# Patient Record
Sex: Female | Born: 1935
Health system: Southern US, Community
[De-identification: ages and names within clinical notes are randomized; demographics above are authoritative.]

## PROBLEM LIST (undated history)

## (undated) DIAGNOSIS — F419 Anxiety disorder, unspecified: Secondary | ICD-10-CM

## (undated) DIAGNOSIS — I10 Essential (primary) hypertension: Secondary | ICD-10-CM

## (undated) DIAGNOSIS — M1990 Unspecified osteoarthritis, unspecified site: Secondary | ICD-10-CM

## (undated) DIAGNOSIS — R002 Palpitations: Secondary | ICD-10-CM

## (undated) DIAGNOSIS — I2510 Atherosclerotic heart disease of native coronary artery without angina pectoris: Secondary | ICD-10-CM

## (undated) DIAGNOSIS — I255 Ischemic cardiomyopathy: Secondary | ICD-10-CM

## (undated) DIAGNOSIS — E785 Hyperlipidemia, unspecified: Secondary | ICD-10-CM

## (undated) HISTORY — PX: BLADDER REPAIR: SHX76

## (undated) HISTORY — DX: Atherosclerotic heart disease of native coronary artery without angina pectoris: I25.10

## (undated) HISTORY — DX: Hyperlipidemia, unspecified: E78.5

## (undated) HISTORY — DX: Palpitations: R00.2

## (undated) HISTORY — DX: Anxiety disorder, unspecified: F41.9

## (undated) HISTORY — DX: Essential (primary) hypertension: I10

---

## 1974-09-17 HISTORY — PX: ABDOMINAL HYSTERECTOMY: SHX81

## 2001-01-02 ENCOUNTER — Ambulatory Visit (HOSPITAL_COMMUNITY): Admission: RE | Admit: 2001-01-02 | Discharge: 2001-01-02 | Payer: Self-pay | Admitting: *Deleted

## 2003-08-24 ENCOUNTER — Ambulatory Visit (HOSPITAL_COMMUNITY): Admission: RE | Admit: 2003-08-24 | Discharge: 2003-08-24 | Payer: Self-pay | Admitting: Family Medicine

## 2004-08-24 ENCOUNTER — Ambulatory Visit (HOSPITAL_COMMUNITY): Admission: RE | Admit: 2004-08-24 | Discharge: 2004-08-24 | Payer: Self-pay | Admitting: Family Medicine

## 2011-04-18 HISTORY — PX: ORIF TIBIA & FIBULA FRACTURES: SHX2131

## 2011-05-10 ENCOUNTER — Emergency Department (HOSPITAL_COMMUNITY): Payer: No Typology Code available for payment source

## 2011-05-10 ENCOUNTER — Inpatient Hospital Stay (HOSPITAL_COMMUNITY)
Admission: AD | Admit: 2011-05-10 | Discharge: 2011-05-14 | DRG: 494 | Disposition: A | Discharge status: Home / Self Care | Payer: No Typology Code available for payment source | Source: Other Acute Inpatient Hospital | Attending: Orthopedic Surgery | Admitting: Orthopedic Surgery

## 2011-05-10 ENCOUNTER — Other Ambulatory Visit: Payer: Self-pay

## 2011-05-10 ENCOUNTER — Inpatient Hospital Stay (HOSPITAL_COMMUNITY): Payer: No Typology Code available for payment source

## 2011-05-10 ENCOUNTER — Emergency Department (HOSPITAL_COMMUNITY)
Admission: EM | Admit: 2011-05-10 | Discharge: 2011-05-10 | Disposition: A | Discharge status: Other Acute Inpatient Hospital | Payer: No Typology Code available for payment source | Source: Home / Self Care | Attending: Emergency Medicine | Admitting: Emergency Medicine

## 2011-05-10 DIAGNOSIS — S82209A Unspecified fracture of shaft of unspecified tibia, initial encounter for closed fracture: Secondary | ICD-10-CM

## 2011-05-10 DIAGNOSIS — R079 Chest pain, unspecified: Secondary | ICD-10-CM | POA: Diagnosis present

## 2011-05-10 DIAGNOSIS — Y92410 Unspecified street and highway as the place of occurrence of the external cause: Secondary | ICD-10-CM | POA: Insufficient documentation

## 2011-05-10 DIAGNOSIS — I498 Other specified cardiac arrhythmias: Secondary | ICD-10-CM | POA: Insufficient documentation

## 2011-05-10 DIAGNOSIS — Y998 Other external cause status: Secondary | ICD-10-CM

## 2011-05-10 DIAGNOSIS — S82409B Unspecified fracture of shaft of unspecified fibula, initial encounter for open fracture type I or II: Secondary | ICD-10-CM | POA: Insufficient documentation

## 2011-05-10 DIAGNOSIS — R030 Elevated blood-pressure reading, without diagnosis of hypertension: Secondary | ICD-10-CM | POA: Diagnosis not present

## 2011-05-10 DIAGNOSIS — S82209B Unspecified fracture of shaft of unspecified tibia, initial encounter for open fracture type I or II: Principal | ICD-10-CM | POA: Diagnosis present

## 2011-05-10 DIAGNOSIS — R000 Tachycardia, unspecified: Secondary | ICD-10-CM | POA: Diagnosis not present

## 2011-05-10 LAB — CBC
HCT: 42.9 % (ref 36.0–46.0)
Hemoglobin: 14.4 g/dL (ref 12.0–15.0)
MCH: 31.7 pg (ref 26.0–34.0)
MCHC: 33.6 g/dL (ref 30.0–36.0)
MCV: 94.5 fL (ref 78.0–100.0)
Platelets: 217 10*3/uL (ref 150–400)
RBC: 4.54 MIL/uL (ref 3.87–5.11)
RDW: 12.8 % (ref 11.5–15.5)

## 2011-05-10 LAB — BASIC METABOLIC PANEL
BUN: 20 mg/dL (ref 6–23)
CO2: 25 mEq/L (ref 19–32)
Calcium: 10.2 mg/dL (ref 8.4–10.5)
Chloride: 101 mEq/L (ref 96–112)
Creatinine, Ser: 0.54 mg/dL (ref 0.50–1.10)
GFR calc Af Amer: >60 mL/min (ref >60)
GFR calc non Af Amer: >60 mL/min (ref >60)
Glucose, Bld: 167 mg/dL — ABNORMAL HIGH (ref 70–99)
Potassium: 3.5 mEq/L (ref 3.5–5.1)
Sodium: 138 mEq/L (ref 135–145)

## 2011-05-10 LAB — URINALYSIS, ROUTINE W REFLEX MICROSCOPIC
Bilirubin Urine: NEGATIVE
Glucose, UA: 100 mg/dL — AB
Hgb urine dipstick: NEGATIVE
Leukocytes, UA: NEGATIVE
Nitrite: NEGATIVE
Protein, ur: NEGATIVE mg/dL
Specific Gravity, Urine: >1.03 — ABNORMAL HIGH (ref 1.005–1.030)
Urobilinogen, UA: 0.2 mg/dL (ref 0.0–1.0)
pH: 5.5 (ref 5.0–8.0)

## 2011-05-10 LAB — APTT: aPTT: 23 seconds — ABNORMAL LOW (ref 24–37)

## 2011-05-10 LAB — PROTIME-INR: INR: 0.88 (ref 0.00–1.49)

## 2011-05-10 MED ORDER — SODIUM CHLORIDE 0.9 % IV SOLN
INTRAVENOUS | Status: DC
  Administered 2011-05-10: 16:00:00 via INTRAVENOUS

## 2011-05-10 MED ORDER — FENTANYL CITRATE 0.05 MG/ML IJ SOLN
50.0000 ug | Freq: Once | INTRAMUSCULAR | Status: AC
  Administered 2011-05-10: 50 ug via INTRAVENOUS

## 2011-05-10 MED ORDER — CEFAZOLIN SODIUM 1-5 GM-% IV SOLN
1.0000 g | Freq: Once | INTRAVENOUS | Status: AC
  Administered 2011-05-10: 1 g via INTRAVENOUS
  Filled 2011-05-10: qty 50

## 2011-05-10 MED ORDER — FENTANYL CITRATE 0.05 MG/ML IJ SOLN
INTRAMUSCULAR | Status: AC
  Administered 2011-05-10: 50 ug via INTRAVENOUS
  Filled 2011-05-10: qty 2
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
## 2011-05-10 NOTE — ED Notes (Signed)
Report given to Blima Rich RN

## 2011-05-10 NOTE — ED Notes (Signed)
Bruising more noticeable to r lower leg, pt still able to wiggle toes, pedal pulse present.  PT requesting medication for pain.  Notified edp.

## 2011-05-10 NOTE — ED Notes (Signed)
Wound cleaned and dressing applied.  

## 2011-05-11 DIAGNOSIS — R011 Cardiac murmur, unspecified: Secondary | ICD-10-CM

## 2011-05-11 LAB — BASIC METABOLIC PANEL
BUN: 15 mg/dL (ref 6–23)
GFR calc Af Amer: >60 mL/min (ref >60)
GFR calc non Af Amer: >60 mL/min (ref >60)
Potassium: 4.2 mEq/L (ref 3.5–5.1)
Sodium: 139 mEq/L (ref 135–145)

## 2011-05-11 LAB — URINE CULTURE
Colony Count: NO GROWTH
Culture  Setup Time: 201208240147
Culture: NO GROWTH

## 2011-05-11 LAB — PROTIME-INR: Prothrombin Time: 13.8 seconds (ref 11.6–15.2)

## 2011-05-11 LAB — CBC
MCH: 31.2 pg (ref 26.0–34.0)
Platelets: 204 10*3/uL (ref 150–400)
RBC: 3.75 MIL/uL — ABNORMAL LOW (ref 3.87–5.11)
WBC: 11.1 10*3/uL — ABNORMAL HIGH (ref 4.0–10.5)
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
## 2011-05-12 LAB — CBC
Hemoglobin: 11.5 g/dL — ABNORMAL LOW (ref 12.0–15.0)
MCHC: 34.1 g/dL (ref 30.0–36.0)

## 2011-05-12 LAB — PROTIME-INR
INR: 1.11 (ref 0.00–1.49)
Prothrombin Time: 14.5 seconds (ref 11.6–15.2)

## 2011-05-12 LAB — BASIC METABOLIC PANEL
GFR calc Af Amer: >60 mL/min (ref >60)
GFR calc non Af Amer: >60 mL/min (ref >60)
Glucose, Bld: 110 mg/dL — ABNORMAL HIGH (ref 70–99)
Potassium: 4 mEq/L (ref 3.5–5.1)
Sodium: 140 mEq/L (ref 135–145)

## 2011-05-13 LAB — PROTIME-INR: Prothrombin Time: 14.6 seconds (ref 11.6–15.2)

## 2011-05-14 ENCOUNTER — Inpatient Hospital Stay
Admission: RE | Admit: 2011-05-14 | Discharge: 2011-06-08 | Disposition: A | Discharge status: Home / Self Care | Payer: BC Managed Care – PPO | Source: Ambulatory Visit | Attending: Internal Medicine | Admitting: Internal Medicine

## 2011-05-14 LAB — PROTIME-INR: INR: 1.85 — ABNORMAL HIGH (ref 0.00–1.49)
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
## 2011-05-31 ENCOUNTER — Ambulatory Visit (INDEPENDENT_AMBULATORY_CARE_PROVIDER_SITE_OTHER): Payer: Medicare Other | Admitting: Cardiology

## 2011-05-31 ENCOUNTER — Encounter: Payer: Self-pay | Admitting: Cardiology

## 2011-05-31 VITALS — BP 128/78 | HR 96 | Ht 60.0 in | Wt 105.0 lb

## 2011-05-31 DIAGNOSIS — I517 Cardiomegaly: Secondary | ICD-10-CM | POA: Insufficient documentation

## 2011-05-31 DIAGNOSIS — R030 Elevated blood-pressure reading, without diagnosis of hypertension: Secondary | ICD-10-CM

## 2011-05-31 DIAGNOSIS — D649 Anemia, unspecified: Secondary | ICD-10-CM | POA: Insufficient documentation

## 2011-05-31 DIAGNOSIS — I10 Essential (primary) hypertension: Secondary | ICD-10-CM | POA: Insufficient documentation

## 2011-05-31 DIAGNOSIS — I471 Supraventricular tachycardia: Secondary | ICD-10-CM

## 2011-05-31 NOTE — Patient Instructions (Signed)
Your physician recommends that you return for lab work in: YOU HAVE BEEN GIVEN LAB ORDERS FOR A TSH AND FASTING LIPID PANEL 427.0  F/U IN 6 MONTHS WITH DR. Dietrich Pates

## 2011-05-31 NOTE — Assessment & Plan Note (Signed)
Blood pressure is normal at this visit in the absence of pharmacologic therapy.  Continued observation is appropriate.

## 2011-05-31 NOTE — Assessment & Plan Note (Addendum)
Patient has evidence for previous inferior MI on her EKG, but this apparently was not verified by her echocardiogram.  She has mild LVH with HOCM physiology, which may have reflected high adrenergic tone at tet time her echocardiogram was performed.  I will review the study and reassess this nice woman in 6 months; however, for now she does not appear to require additional testing or any pharmacologic therapy.
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
## 2011-06-15 ENCOUNTER — Other Ambulatory Visit: Payer: Self-pay

## 2011-06-15 DIAGNOSIS — D649 Anemia, unspecified: Secondary | ICD-10-CM

## 2011-06-15 DIAGNOSIS — E785 Hyperlipidemia, unspecified: Secondary | ICD-10-CM

## 2011-06-15 DIAGNOSIS — I10 Essential (primary) hypertension: Secondary | ICD-10-CM

## 2011-07-02 ENCOUNTER — Encounter: Payer: Self-pay | Admitting: Cardiology

## 2011-07-17 ENCOUNTER — Ambulatory Visit (INDEPENDENT_AMBULATORY_CARE_PROVIDER_SITE_OTHER): Payer: Medicare Other | Admitting: Adult Health

## 2011-07-17 ENCOUNTER — Encounter: Payer: Self-pay | Admitting: Adult Health

## 2011-07-17 VITALS — BP 178/101 | HR 114 | Ht 60.0 in | Wt 105.0 lb

## 2011-07-17 DIAGNOSIS — R002 Palpitations: Secondary | ICD-10-CM | POA: Insufficient documentation

## 2011-07-17 DIAGNOSIS — R030 Elevated blood-pressure reading, without diagnosis of hypertension: Secondary | ICD-10-CM

## 2011-07-17 DIAGNOSIS — I479 Paroxysmal tachycardia, unspecified: Secondary | ICD-10-CM

## 2011-07-17 NOTE — Assessment & Plan Note (Signed)
She is hypertensive and anxious today. Repeat BP 158/ 98. We will continue the lisinopril dose. If she comes back hypertensive, may have to increase or add medication. If BB is used for tachycardia, will not recommend increasing the dose.  Will make more recommendations after follow-up.

## 2011-07-17 NOTE — Assessment & Plan Note (Signed)
Will have cardionet monitor place to assess for rhythm and rate, along with frequency. I will not put her on rate control medications until we have a definite evaluation. She is advised to call our office should she experience rapid HR that does not resolve on its own.  Otherwise, we will see her on follow-up and make recommendations after she has monitor strips read.
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
## 2011-07-17 NOTE — Patient Instructions (Signed)
Your physician recommends that you schedule a follow-up appointment in: 1 month with Dr Dietrich Pates  Your physician has recommended that you wear an event monitor. Event monitors are medical devices that record the heart's electrical activity. Doctors most often [place] these monitors to diagnose arrhythmias. Arrhythmias are problems with the speed or rhythm of the heartbeat. The monitor is a small, portable device. You can wear one while you do your normal daily activities. This is usually used to diagnose what is causing palpitations/syncope (passing out).

## 2011-07-23 ENCOUNTER — Telehealth: Payer: Self-pay

## 2011-07-23 ENCOUNTER — Telehealth: Payer: Self-pay | Admitting: Adult Health
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
## 2011-07-23 NOTE — Telephone Encounter (Signed)
Patient states that she has questions regarding her "heart monitor" that Samara Deist ordered for her. / tg

## 2011-07-31 ENCOUNTER — Telehealth: Payer: Self-pay | Admitting: Cardiology

## 2011-07-31 NOTE — Telephone Encounter (Signed)
Patient states that her heart beats so fast at night that she can't sleep. Wants to know what she can do about it. / tg

## 2011-08-01 NOTE — Telephone Encounter (Signed)
Pt. states that she wakes up most every night with fast HR and that she is nervious through out the day. Her PCP, Dr. Regino Schultze, recommended that she take Xanax but pt. refused b/c she does not like the way she feels when she takes. Pt. is wearing an Event monitor at this time, we have not received any critical or abnormal readings from this. She takes Lisinopril 10 mg in the am and is not on any other cardiac medication at this time. Pt. Wants to know what she should do. Please advise./LV

## 2011-08-02 NOTE — Telephone Encounter (Signed)
Continue to wear cardiac monitor. Please call the company to see if there have been any arrhythmias are abnormal heart rates.  She can take xanax at night.

## 2011-08-03 NOTE — Telephone Encounter (Signed)
**Note De-Identified Burlene Montecalvo Obfuscation** Pt. advised that her Event monitor results have been normal to this point and that we will continue to monitor. Pt. refuses to take Xanax b/c she does not like the way they make her feel. Pt. advised to continue to wear monitor unti 08-09-11. She states  She states she understands instructions given. /LV

## 2011-08-07 ENCOUNTER — Ambulatory Visit (HOSPITAL_COMMUNITY)
Admission: RE | Admit: 2011-08-07 | Discharge: 2011-08-07 | Disposition: A | Discharge status: Home / Self Care | Payer: Medicare Other | Source: Ambulatory Visit | Attending: Family Medicine | Admitting: Family Medicine

## 2011-08-07 DIAGNOSIS — M25673 Stiffness of unspecified ankle, not elsewhere classified: Secondary | ICD-10-CM | POA: Insufficient documentation

## 2011-08-07 DIAGNOSIS — M6281 Muscle weakness (generalized): Secondary | ICD-10-CM | POA: Insufficient documentation

## 2011-08-07 DIAGNOSIS — R29898 Other symptoms and signs involving the musculoskeletal system: Secondary | ICD-10-CM | POA: Insufficient documentation

## 2011-08-07 DIAGNOSIS — M25579 Pain in unspecified ankle and joints of unspecified foot: Secondary | ICD-10-CM | POA: Insufficient documentation

## 2011-08-07 DIAGNOSIS — M25676 Stiffness of unspecified foot, not elsewhere classified: Secondary | ICD-10-CM | POA: Insufficient documentation

## 2011-08-07 DIAGNOSIS — IMO0001 Reserved for inherently not codable concepts without codable children: Secondary | ICD-10-CM | POA: Insufficient documentation

## 2011-08-07 DIAGNOSIS — R262 Difficulty in walking, not elsewhere classified: Secondary | ICD-10-CM | POA: Insufficient documentation
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
## 2011-08-14 ENCOUNTER — Ambulatory Visit (HOSPITAL_COMMUNITY)
Admission: RE | Admit: 2011-08-14 | Discharge: 2011-08-14 | Disposition: A | Discharge status: Home / Self Care | Payer: Medicare Other | Source: Ambulatory Visit | Attending: Family Medicine | Admitting: Family Medicine

## 2011-08-14 NOTE — Progress Notes (Signed)
Physical Therapy Treatment Patient Details  Name: [name] MRN: [id number] Date of Birth: 04/18/1928  Today's Date: 08/14/2011 Time: 3086-5784 Time Calculation (min): 47 min Visit#: 2  of 8   Re-eval: 09/06/11  Charge: therex 40 min  Subjective: Symptoms/Limitations Symptoms: I'm fine today, no pain R ankle. Pain Assessment Currently in Pain?: No/denies  Objective: Pt standing therex completed with cam boot WBAT  Exercise/Treatments Ankle Exercises Ankle Circles/Pumps: 10 reps;Seated Ankle Dorsiflexion: AROM;10 reps;Seated Ankle Plantar Flexion: AROM;10 reps;Seated Ankle Eversion: AROM;10 reps;Seated Ankle Inversion: AROM;10 reps;Seated Heel Raises: 15 reps;4 seconds Heel Raises Limitations: seated Toe Raise: 10 reps;5 seconds Toe Raise Limitations: seated  ABC's: 1 rep Towel Crunch: Limitations Towel Crunch Limitations: 2 min Towel Inversion/Eversion: Limitations Towel Inversion/Eversion Limitations: with knee mobilized by therapist for increased ankle ROM BAPS: Sitting;Level 2;10 reps;Limitations BAPS Limitations: A/P, R/L, CW/CCW with knee mobilized by therapist for increased ankle ROM   Physical Therapy Assessment and Plan PT Assessment and Plan Clinical Impression Statement: Pt with decreased R Ankle AROM and strength, required knee mobilization by therapist to reduce compensation to focus on ankle ROM. Pt unable to flex toes wtih towel cruch and the inversion/eversion towel therex. PT Plan: Continue with current POC, assess update from MD appt tomorrow, begin balance activities continue strengthening and ROM therex.    Goals    Problem List Patient Active Problem List  Diagnoses  . Borderline hypertension  . Anemia  . LVH (left ventricular hypertrophy)  . Tachycardia, paroxysmal  . Ankle weakness  . Stiffness of joint, not elsewhere classified, ankle and foot  . Difficulty in walking    PT - End of Session Activity Tolerance: Patient tolerated  treatment well General Behavior During Session: [facility] for tasks performed Cognition: [facility] for tasks performed  Juel Burrow 08/14/2011, 2:49 PM

## 2011-08-16 ENCOUNTER — Ambulatory Visit (HOSPITAL_COMMUNITY)
Admission: RE | Admit: 2011-08-16 | Discharge: 2011-08-16 | Disposition: A | Discharge status: Home / Self Care | Payer: Medicare Other | Source: Ambulatory Visit | Attending: Family Medicine | Admitting: Family Medicine

## 2011-08-16 NOTE — Progress Notes (Signed)
Physical Therapy Treatment Patient Details  Name: [name] MRN: [id number] Date of Birth: Nov 08, 1927  Today's Date: 08/16/2011 Time: 1478-2956 Time Calculation (min): 45 min Visit#: 3  of 8   Re-eval: 09/06/11  Charge: Gait 8 min therex 37 min  Subjective: No pain R ankle today, pt came in ambulating with CAM boot on brought in lace up brace to find out how to put on foot  Objective:   Mobility (including Balance) Ambulation/Gait Ambulation/Gait: Yes Ambulation/Gait Assistance: 6: Modified independent (Device/Increase time) Ambulation Distance (Feet): 250 Feet Assistive device: Straight cane Gait Pattern: Decreased stride length;Decreased stance time - right     Exercise/Treatments Ankle Exercises Ankle Circles/Pumps: HEP Ankle Dorsiflexion: AROM;10 reps;Seated  Ankle Plantar Flexion: AROM;10 reps;Seated  Ankle Eversion: AROM;10 reps;Seated  Ankle Inversion: AROM;10 reps;Seated  Heel Raises: 15 reps standing Toe Raise: 15 reps standing   ABC's: HEP Towel Crunch: 2 RT Towel Inversion/Eversion: Limitations  Towel Inversion/Eversion Limitations: with knee stabilized by therapist for increased ankle ROM  BAPS: Sitting;Level 2;10 reps;Limitations  BAPS Limitations: A/P, R/L, CW/CCW with knee mobilized by therapist for increased ankle ROM  Gait training with SPC for proper sequence mechanics. Tandem stance 2x 30" with HHA 3x 30" with HHA    Physical Therapy Assessment and Plan PT Assessment and Plan Clinical Impression Statement: Pt with improving ankle AROM with inversion/eversion, required knee stabilized with BAPS to reduce compensation.  Began gait with SPC with proper mechanics following vc for proper sequence.  Began balance activities with HHA required to prevent falls.  Pt educated on proper technique to put on/take off ankle lace brace, pt able to remove and place back on with min cueing required.  PT Plan: Continue with current POC, improve strength, ROM,  balance and begin tband activities to add to HEP next session.    Goals    Problem List Patient Active Problem List  Diagnoses  . Borderline hypertension  . Anemia  . LVH (left ventricular hypertrophy)  . Tachycardia, paroxysmal  . Ankle weakness  . Stiffness of joint, not elsewhere classified, ankle and foot  . Difficulty in walking    PT - End of Session Activity Tolerance: Patient tolerated treatment well General Behavior During Session: [facility] for tasks performed Cognition: Impaired, at baseline  Juel Burrow 08/16/2011, 6:36 PM

## 2011-08-17 ENCOUNTER — Encounter: Payer: Self-pay | Admitting: Cardiology

## 2011-08-17 ENCOUNTER — Ambulatory Visit (INDEPENDENT_AMBULATORY_CARE_PROVIDER_SITE_OTHER): Payer: Medicare Other | Admitting: Cardiology

## 2011-08-17 DIAGNOSIS — D649 Anemia, unspecified: Secondary | ICD-10-CM

## 2011-08-17 DIAGNOSIS — F411 Generalized anxiety disorder: Secondary | ICD-10-CM

## 2011-08-17 DIAGNOSIS — I479 Paroxysmal tachycardia, unspecified: Secondary | ICD-10-CM

## 2011-08-17 DIAGNOSIS — R030 Elevated blood-pressure reading, without diagnosis of hypertension: Secondary | ICD-10-CM

## 2011-08-17 DIAGNOSIS — F419 Anxiety disorder, unspecified: Secondary | ICD-10-CM | POA: Insufficient documentation

## 2011-08-17 MED ORDER — LISINOPRIL 10 MG PO TABS: 10.0000 mg | ORAL_TABLET | Freq: Every day | ORAL | Status: DC

## 2011-08-17 MED ORDER — ATENOLOL 50 MG PO TABS: ORAL_TABLET | ORAL | Status: DC
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
## 2011-08-17 NOTE — Assessment & Plan Note (Addendum)
Patient is perseverating on her anxieties and generally appears to be suffering significant mental distress.  She was quite resistant to my suggestion that she consider psychotherapy or at least evaluation by a psychologist, but her daughter warmly welcomed the suggestion and insisted that an appointment be scheduled.  Daughter was familiar with Dr. Dellia Cloud and was eager that he meet with her mother.

## 2011-08-17 NOTE — Assessment & Plan Note (Signed)
Blood pressure appears to be labile with significant elevations at times.  This may account for her left ventricular hypertrophy, or that may be a distinct hypertrophic cardiomyopathy.  In any case, I do not expect hypertrophy to cause significant problems for her.  Both blood pressure and abnormal hemodynamics within the left ventricle may be benefited by treatment with beta blocker.  She has taken atenolol in the past and agrees to start at a low dose of 25 mg per day.

## 2011-08-17 NOTE — Assessment & Plan Note (Addendum)
Patient does not clearly have an arrhythmia.  Tachycardia has been sinus tachycardia when recorded.

## 2011-08-17 NOTE — Patient Instructions (Signed)
Your physician recommends that you schedule a follow-up appointment in: 2 months  Your physician has recommended you make the following change in your medication:  START Atenolol 50 mg 1/2 tablet each am  See Dr Dellia Cloud when an appointment is provided

## 2011-08-20 ENCOUNTER — Encounter: Payer: Self-pay | Admitting: Cardiology

## 2011-08-20 ENCOUNTER — Telehealth: Payer: Self-pay | Admitting: Cardiology

## 2011-08-20 NOTE — Telephone Encounter (Signed)
Patient states she is "having problems" and would like return phone call. / tg

## 2011-08-20 NOTE — Telephone Encounter (Signed)
Patient concerned about feeling weak.  Advised her that the ativan will tend to make her feel a bit woozy and if she needs to cut back on it until she sees Dr Dellia Cloud then that would be fine.  Was started on Atenolol 25 mg daily on Nov 30, however pressure is normal in the 130's.

## 2011-08-22 ENCOUNTER — Ambulatory Visit (HOSPITAL_COMMUNITY)
Admission: RE | Admit: 2011-08-22 | Discharge: 2011-08-22 | Disposition: A | Discharge status: Home / Self Care | Payer: Medicare Other | Source: Ambulatory Visit | Attending: Family Medicine | Admitting: Family Medicine

## 2011-08-22 DIAGNOSIS — M6281 Muscle weakness (generalized): Secondary | ICD-10-CM | POA: Insufficient documentation

## 2011-08-22 DIAGNOSIS — R262 Difficulty in walking, not elsewhere classified: Secondary | ICD-10-CM | POA: Insufficient documentation

## 2011-08-22 DIAGNOSIS — IMO0001 Reserved for inherently not codable concepts without codable children: Secondary | ICD-10-CM | POA: Insufficient documentation

## 2011-08-22 DIAGNOSIS — M25676 Stiffness of unspecified foot, not elsewhere classified: Secondary | ICD-10-CM | POA: Insufficient documentation

## 2011-08-22 DIAGNOSIS — M25673 Stiffness of unspecified ankle, not elsewhere classified: Secondary | ICD-10-CM | POA: Insufficient documentation

## 2011-08-22 DIAGNOSIS — M25579 Pain in unspecified ankle and joints of unspecified foot: Secondary | ICD-10-CM | POA: Insufficient documentation

## 2011-08-22 NOTE — Progress Notes (Signed)
Physical Therapy Treatment Patient Details  Name: [name] MRN: [id number] Date of Birth: July 01, 1928  Today's Date: 08/22/2011 Time: 4098-1191 Time Calculation (min): 56 min Co-treat from 4782-9562 w/husband: Charges: 15' TE, 8' Gt, 8' Manual 16' Visit#: 4  of 8   Re-eval: 09/06/11   Subjective: Symptoms/Limitations Symptoms: "i am just scared to walk with a cane, I feel so off balance.  Also today I had my eyes dilated" Pain Assessment Currently in Pain?: No/denies  Mobility (including Balance) Ambulation/Gait Ambulation/Gait: Yes (Indoor/Outdoor environment with SPC) Ambulation/Gait Assistance: 6: Modified independent (Device/Increase time) Ambulation Distance (Feet):  (8' Outdoor surface, CGA to descend ramp) Assistive device: Straight cane Gait Pattern: Step-through pattern     Exercise/Treatments Stretches Gastroc Stretch: 3 reps;30 seconds Soleus Stretch: 2 reps;30 seconds Aerobic Stationary Bike: 8' for endurance Standing Gait Training: See gait: w/SPC in indoor and outdoor environment x8'  Ankle Exercises Ankle Dorsiflexion: AROM;Right;10 reps;Seated;Limitations Ankle Dorsiflexion Limitations: Manual Resistance Ankle Plantar Flexion: AROM;10 reps;Limitations Ankle Plantar Flexion Limitations: Manual Resistance Ankle Eversion: AROM;10 reps;Limitations Ankle Eversion Limitations: Manual Resistance Ankle Inversion: AROM;10 reps;Limitations Ankle Inversion Limitations: Manual Resistance Heel Raises: 20 reps Heel Raises Limitations: standing Toe Raise: 20 reps Toe Raise Limitations: standing  Towel Crunch: 5 reps Towel Inversion/Eversion: 5 reps Towel Inversion/Eversion Limitations: each direction  Manual Therapy Manual Therapy: Other (comment) Joint Mobilization: Grade II-III joint mobs to increase calceneal and metatarsal mobility and promote appropriate syndesmotic joint mobility.  x8' Other Manual Therapy: Education on appropriate brace wear.  Required  min A then able to demonstrate independently x8'  Physical Therapy Assessment and Plan PT Assessment and Plan Clinical Impression Statement: Co-treat after bike warm up from 1444-1515 w/husband.  She has improved ambulation and decreased fear with ambulation of SPC in outdoor environment.  She has improved strength and ROM overall.  Pt able to independently demonstrate appropriate donning of LE ALSO after minimal cueing.   PT Plan: Cont to progress.  Gave pt. Blue Tband for home w/exercises.     Goals    Problem List Patient Active Problem List  Diagnoses  . Borderline hypertension  . Anemia  . LVH (left ventricular hypertrophy)  . Tachycardia, paroxysmal  . Anxiety       [name] 08/22/2011, 3:59 PM

## 2011-08-23 ENCOUNTER — Ambulatory Visit (HOSPITAL_COMMUNITY)
Admission: RE | Admit: 2011-08-23 | Discharge: 2011-08-23 | Disposition: A | Discharge status: Home / Self Care | Payer: Medicare Other | Source: Ambulatory Visit | Attending: Family Medicine | Admitting: Family Medicine

## 2011-08-23 NOTE — Progress Notes (Signed)
Physical Therapy Treatment Patient Details  Name: [name] MRN: [id number] Date of Birth: 1927-10-24  Today's Date: 08/23/2011 Time: 4098-1191 Time Calculation (min): 43 min Visit#: 5  of 8   Re-eval: 09/06/11 Charges:  therex 32', ice 10'    Subjective: Symptoms/Limitations Symptoms: Pt. ambulated into clinic with SPC.  States she has been using it today.  pt. concerned with swelling above ALSO.  Instructed pt. to  wear compression stocking under ALSO to level fluid/circulation. Pain Assessment Currently in Pain?: No/denies   Exercise/Treatments   Stretches   All therapy done today without ALSO on ankle Gastroc Stretch: 3 reps;30 seconds Soleus Stretch: 2 reps;30 seconds Aerobic Stationary Bike: time SLS: 6" max of 5 trials  Ankle Exercises Ankle Dorsiflexion: 10 reps;Theraband;Other (comment) Theraband Level (Ankle Dorsiflexion): Level 4 (Blue) Ankle Plantar Flexion: 10 reps;Theraband Theraband Level (Ankle Plantar Flexion): Level 4 (Blue) Ankle Eversion: AROM;10 reps;Limitations Ankle Eversion Limitations: Manual Resistance Ankle Inversion: AROM;10 reps;Limitations Ankle Inversion Limitations: Manual Resistance Heel Raises: 20 reps Heel Raises Limitations: standing Toe Raise: 20 reps Toe Raise Limitations: standing  Towel Crunch: Limitations Towel Crunch Limitations: 2 minutes Towel Inversion/Eversion: 5 reps Towel Inversion/Eversion Limitations: each direction BAPS: Sitting;Level 2;10 reps;Limitations BAPS Limitations: A/P, R/L, CW/CCW with knee stabilized by therapist for increased ankle ROM SLS: 6" max of 5 trials   Modalities Modalities: Cryotherapy Cryotherapy Number Minutes Cryotherapy: 10 Minutes Cryotherapy Location: Ankle Type of Cryotherapy: Ice pack  Physical Therapy Assessment and Plan PT Assessment and Plan Clinical Impression Statement: Performed all therex without ALSO on today.  Pt. hesitant initially but able to complete without pain/LOB.   Increased to theraband for DF/PF but too weak with inv/eversion. PT Plan: Continue to progress stability.     Problem List Patient Active Problem List  Diagnoses  . Borderline hypertension  . Anemia  . LVH (left ventricular hypertrophy)  . Tachycardia, paroxysmal  . Anxiety    PT - End of Session Activity Tolerance: Patient tolerated treatment well General Behavior During Session: [facility] for tasks performed Cognition: [facility] for tasks performed  Emeline Gins B 08/23/2011, 2:33 PM

## 2011-08-28 ENCOUNTER — Ambulatory Visit (HOSPITAL_COMMUNITY)
Admission: RE | Admit: 2011-08-28 | Discharge: 2011-08-28 | Disposition: A | Discharge status: Home / Self Care | Payer: Medicare Other | Source: Ambulatory Visit | Attending: Family Medicine | Admitting: Family Medicine

## 2011-08-28 ENCOUNTER — Encounter: Payer: Self-pay | Admitting: *Deleted

## 2011-08-28 ENCOUNTER — Telehealth: Payer: Self-pay | Admitting: Cardiology

## 2011-08-28 NOTE — Telephone Encounter (Signed)
Patient states feeling better at this time.  States she did not get lab work done that was ordered day of visit, to include TSH, which could account for weakness and is the reason he ordered it.  Will mail lab slips to patient again.  Advised her to see her pcp if she continues to feel weak.

## 2011-08-28 NOTE — Telephone Encounter (Signed)
PT HAS BEEN FEELING WEEK FOR A FEW DAY WITH NO OTHER SYMPTOMS. STATES THAT IS HAPPENS AROUND LUNCH TIME.

## 2011-08-28 NOTE — Progress Notes (Signed)
Physical Therapy Treatment Patient Details  Name: [name] MRN: [id number] Date of Birth: [birth year]  Today's Date: 08/28/2011 Time: 8657-8469 Time Calculation (min): 43 min Visit#: 6  of 8   Re-eval: 09/06/11  Charge: therex 43 min  Subjective: Symptoms/Limitations Symptoms: Pt stated she feels really weak today, reports feeling this way since began the blood pressure meds a couple of weeks ago.  Pt reported she did not have any compression stocking at home.  No ankle pain today.  Pt requrested to hold the bicycle today. Pain Assessment Currently in Pain?: No/denies  Objective:   Exercise/Treatments Ankle Exercises Ankle Dorsiflexion: 20 reps;Seated;Theraband Theraband Level (Ankle Dorsiflexion): Level 4 (Blue) Ankle Plantar Flexion: Theraband;20 reps Theraband Level (Ankle Plantar Flexion): Level 4 (Blue) Ankle Eversion: AROM;Strengthening;15 reps;Limitations Ankle Eversion Limitations: Manual Resistance Ankle Inversion: AROM;15 reps;Limitations Ankle Inversion Limitations: Manual Resistance Heel Raises: 20 reps Heel Raises Limitations: standing Toe Raise: 20 reps Toe Raise Limitations: standing  Towel Inversion/Eversion: 5 reps Towel Inversion/Eversion Limitations: each direction BAPS: Sitting;Level 2;10 reps BAPS Limitations: A/P, R/L, CW/CCW with knee stabilized by therapist for increased ankle ROM SLS: 3x 30" intermittent HHAL 13"; R 7" max   Physical Therapy Assessment and Plan PT Assessment and Plan Clinical Impression Statement: Performed all therex without ALSO again today, pt hesitant with decrease stance phase shown with gait, able to improve following vc-ing.  Pt continues to show increased ankle strength and ROM, but weakness still shown with inv/eversion. PT Plan: Continue to progress stability, ankle strength, ROM and improve balance.    Goals Home Exercise Program Pt will Perform Home Exercise Program: Independently PT Short Term Goals Time to  Complete Short Term Goals: 2 weeks PT Short Term Goal 1: Pt will improve her R LE ankle strength by 1 muscle grade.  PT Short Term Goal 2: Pt will improve her R ankle AROM by 5 degrees in each direction.  PT Short Term Goal 3: Pt will ambulate independently x5 minutes without an increase in pain.  PT Short Term Goal 4: Pt will demonstrate R SLS x10 sec static surface. PT Long Term Goals Time to Complete Long Term Goals: 4 weeks PT Long Term Goal 1: Pt will improve R ankle AROM to WNL in order to continue with driving acitivities to decrease need of home health aides.  PT Long Term Goal 2: Pt will improve R ankle strength to WNL in order to ascend and descend 10 stairs with 1 handrail to safely enter community areas.  Long Term Goal 3: Pt will improve her activity tolerance and report ability to ambulate independently x15 minutes with reports of mild pain to continue with community activities.  Long Term Goal 4: Pt will improve balance and demonstrate ambulating on a dynamic surface independently x100 ft to continue safely with outdoor household activities.  PT Long Term Goal 5: Pt will improve her LEFS by 9 points for improved percieved functional ability.   Problem List Patient Active Problem List  Diagnoses  . Borderline hypertension  . Anemia  . LVH (left ventricular hypertrophy)  . Tachycardia, paroxysmal  . Anxiety    PT - End of Session Activity Tolerance: Patient tolerated treatment well General Behavior During Session: [facility] for tasks performed Cognition: [facility] for tasks performed  Juel Burrow 08/28/2011, 2:43 PM

## 2011-08-30 ENCOUNTER — Ambulatory Visit (HOSPITAL_COMMUNITY)
Admission: RE | Admit: 2011-08-30 | Discharge: 2011-08-30 | Disposition: A | Discharge status: Home / Self Care | Payer: Medicare Other | Source: Ambulatory Visit | Attending: Family Medicine | Admitting: Family Medicine

## 2011-08-30 NOTE — Progress Notes (Signed)
Physical Therapy Treatment Patient Details  Name: [name] MRN: [id number] Date of Birth: 1927/09/24  Today's Date: 08/30/2011 Time: 1191-4782 Time Calculation (min): 46 min Visit#: 7  of 8   Re-eval: 09/06/11  Charge: therex 46 min  Subjective: Symptoms/Limitations Symptoms: Pt stated she found some dressing hose that she wore in today stated she thought might be a little compression for her ankle edema.  Pt reported redness around incision on ankle, wants therapists to take a look at it. Pain Assessment Currently in Pain?: No/denies  Objective:   Exercise/Treatments Ankle Exercises Heel Raises: 20 reps Heel Raises Limitations: standing Toe Raise: 20 reps Toe Raise Limitations: standing  SLS: 3x 30" intermittent HHA L 19"; R 11" max with no HHA Rocker Board: 2 minutes Toe Walk (Round Trip): attempted, too weak to  Tandem stance 3x 30" Tandem Gait 2 RT   Physical Therapy Assessment and Plan PT Assessment and Plan Clinical Impression Statement: Added rockerboard for ankle stability and strength, pt able to complete following cueing for proper tech.  Good ankle strategy shown with BAPS today.  Added tandem stance/gait with mod assistance required to reduce falls.  Erythema around incision not heat, no signs of infection.  Pt encouraged to continue looking at incision, and inform MD if continues to worry about wound. PT Plan: Reassess next session.    Goals    Problem List Patient Active Problem List  Diagnoses  . Borderline hypertension  . Anemia  . LVH (left ventricular hypertrophy)  . Tachycardia, paroxysmal  . Anxiety    PT - End of Session Activity Tolerance: Patient tolerated treatment well General Behavior During Session: [facility] for tasks performed Cognition: [facility] for tasks performed  Juel Burrow 08/30/2011, 2:33 PM

## 2011-09-04 ENCOUNTER — Ambulatory Visit (HOSPITAL_COMMUNITY)
Admission: RE | Admit: 2011-09-04 | Discharge: 2011-09-04 | Disposition: A | Discharge status: Home / Self Care | Payer: Medicare Other | Source: Ambulatory Visit | Attending: Family Medicine | Admitting: Family Medicine

## 2011-09-04 NOTE — Progress Notes (Addendum)
Physical Therapy Treatment/Progess Note Patient Details  Name: [name] MRN: [id number] Date of Birth: 1928/01/17  Today's Date: 09/04/2011 Time: 4098-1191 Time Calculation (min): 47 min Charges: 1 ROM, 1 MMT, 25' NMR, 5 gt, 5 self care Visit#: 8  of 16   Re-eval: 10/04/11    Subjective: Symptoms/Limitations Symptoms: Pt reports that she has pain at most a 1/10.  She states she is seeking help from a psycologist to discuss increased fear about being alone.   Pain Assessment Currently in Pain?: No/denies  Mobility (including Balance) Ambulation/Gait Ambulation/Gait: Yes Ambulation/Gait Assistance: 7: Independent Ambulation Distance (Feet):  (5 minutes in hospital)  Static Standing Balance Single Leg Stance - Right Leg: 0  Tandem Stance - Right Leg: 7   09/04/11 1400  RLE AROM (degrees)  Right Ankle Dorsiflexion 0-20 0   Right Ankle Plantar Flexion 0-45 25   Right Ankle Inversion 20   Right Ankle Eversion 0   RLE PROM (degrees)  Right Ankle Dorsiflexion 0-20 3   Right Ankle Plantar Flexion 0-45 30   Right Ankle Inversion 20   Right Ankle Eversion 20   RLE Strength  Right Ankle Dorsiflexion 5/5  Right Ankle Plantar Flexion 3/5  Right Ankle Inversion 3+/5  Right Ankle Eversion 3+/5  Static Standing Balance  Single Leg Stance - Right Leg 0   Tandem Stance - Right Leg 7      Exercise/Treatments Aerobic Stationary Bike: 6' 3.0 Standing SLS: 3x 30" intermittent HHA L 19"; R 11" max with no HHA Other Standing Knee Exercises: 5 minutes throughout the hospital; Tandem gait and retro gait 3 RT Other Standing Knee Exercises: without ankle brace: tandem gait, retro gait 3 RT each, heel walking 2 RT (8 minutes in total)  Ankle Exercises Heel Raises: 20 reps Heel Raises Limitations: standing Toe Raise: 20 reps Toe Raise Limitations: standing  SLS: 3x 30" intermittent HHA L 19"; R 11" max with no HHA  Encouraged pt to continue with endurance training at home and walk  with her husband to their mailbox (about 3-5 minutes away) 3-5x a day to improve her activity tolerance as long as a caretaker was near by and/or with her cell phone available.  Educated on the importance of continuing with her HEP and to start ambulating inside the house without her ALSO on. Educated on seeking information on an assisted living environment for her and her husband to improve overall safety.   Physical Therapy Assessment and Plan PT Assessment and Plan Clinical Impression Statement: Progress Note complete.  Ms. Tesfaye has attended 8 OPPT visits and has met 4/5 STG and 0/5 LTG.  She has improved her overall ankle strength and ROM, however continues to have the greatest limitation with her balance, activity tolerance and functional ability.  She will continue to benefit from skilled OPPT in order to address the above impairments in order to maximize independence and function to met remaining goals.   Rehab Potential: Good PT Frequency: Min 2X/week PT Duration: 4 weeks PT Treatment/Interventions: Gait training;Balance training;Therapeutic exercise;Patient/family education;Therapeutic activities PT Plan: Cont to address functional ability, add stairs, balance activities, improve gastroc strength.     Goals Home Exercise Program Pt will Perform Home Exercise Program: Independently PT Goal: Perform Home Exercise Program - Progress: Met PT Short Term Goals Time to Complete Short Term Goals: 2 weeks PT Short Term Goal 1: Pt will improve her R LE ankle strength by 1 muscle grade.  PT Short Term Goal 1 - Progress: Met PT  Short Term Goal 2: Pt will improve her R ankle AROM by 5 degrees in each direction.  PT Short Term Goal 2 - Progress: Met PT Short Term Goal 3: Pt will ambulate independently x5 minutes without an increase in pain.  PT Short Term Goal 3 - Progress: Met PT Short Term Goal 4: Pt will demonstrate R SLS x10 sec static surface. PT Short Term Goal 4 - Progress: Not met PT Long  Term Goals Time to Complete Long Term Goals: 4 weeks PT Long Term Goal 1: Pt will improve R ankle AROM to WNL in order to continue with driving acitivities to decrease need of home health aides.  PT Long Term Goal 1 - Progress: Not met PT Long Term Goal 2: Pt will improve R ankle strength to WNL in order to ascend and descend 10 stairs with 1 handrail to safely enter community areas.  PT Long Term Goal 2 - Progress: Not met Long Term Goal 3: Pt will improve her activity tolerance and report ability to ambulate independently x15 minutes with reports of mild pain to continue with community activities.  Long Term Goal 3 Progress: Not met Long Term Goal 4: Pt will improve balance and demonstrate ambulating on a dynamic surface independently x100 ft to continue safely with outdoor household activities.  Long Term Goal 4 Progress: Not met PT Long Term Goal 5: Pt will improve her LEFS by 9 points for improved percieved functional ability.  Long Term Goal 5 Progress: Not met  Problem List Patient Active Problem List  Diagnoses  . Borderline hypertension  . Anemia  . LVH (left ventricular hypertrophy)  . Tachycardia, paroxysmal  . Anxiety    PT - End of Session Activity Tolerance: Patient tolerated treatment well  Stanislaus Kaltenbach 09/04/2011, 2:47 PM

## 2011-09-06 ENCOUNTER — Ambulatory Visit (HOSPITAL_COMMUNITY)
Admission: RE | Admit: 2011-09-06 | Discharge: 2011-09-06 | Disposition: A | Discharge status: Home / Self Care | Payer: Medicare Other | Source: Ambulatory Visit | Attending: Family Medicine | Admitting: Family Medicine

## 2011-09-06 NOTE — Progress Notes (Signed)
Physical Therapy Treatment Patient Details  Name: [name] MRN: [id number] Date of Birth: August 14, 1928  Today's Date: 09/06/2011 Time: 4098-1191 Time Calculation (min): 41 min Visit#: 9  of 16   Re-eval: 10/04/11 Charges:  therex 40'    Subjective: Symptoms/Limitations Symptoms: Pt. states her R knee is a little sore, but no ankle pain. States her legs are just so weak that she can't tolerate much activity without having to rest. Pain Assessment Currently in Pain?: No/denies   Exercise/Treatments Aerobic Stationary Bike: 6' 4.0 Standing Heel Raises: 15 reps;Limitations Heel Raises Limitations: back against wall, no UE's Lateral Step Up: 10 reps;Step Height: 4" Forward Step Up: 10 reps;Step Height: 4" SLS: 6" max of 5 trials SLS with Vectors: 5X each Other Standing Knee Exercises: BAPS L2 10X each way Other Standing Knee Exercises: balance beam 2RT SLS: 6" max of 5 trials Heel Walk (Round Trip): 1RT Toe Walk (Round Trip): 1RT  Physical Therapy Assessment and Plan PT Assessment and Plan Clinical Impression Statement: Pt. continues to have difficulty with heelwalking/heelraises with  decreased eccentric control with activity. Changed heelraises to against wall, working on eccentric control with improved results.  Added several new activities to address LE weakness and instability.  No complaints of pain with any activity. PT Plan: Continue to progress.  Add wallsquats and LAQ's, Progress to reciprocal steps.     Problem List Patient Active Problem List  Diagnoses  . Borderline hypertension  . Anemia  . LVH (left ventricular hypertrophy)  . Tachycardia, paroxysmal  . Anxiety    PT - End of Session Activity Tolerance: Patient tolerated treatment well General Behavior During Session: [facility] for tasks performed Cognition: [facility] for tasks performed  Emeline Gins B 09/06/2011, 1:49 PM

## 2011-09-13 ENCOUNTER — Ambulatory Visit (HOSPITAL_COMMUNITY): Payer: Medicare Other | Admitting: *Deleted

## 2011-09-14 ENCOUNTER — Ambulatory Visit (HOSPITAL_COMMUNITY)
Admission: RE | Admit: 2011-09-14 | Discharge: 2011-09-14 | Disposition: A | Discharge status: Home / Self Care | Payer: Medicare Other | Source: Ambulatory Visit | Attending: *Deleted | Admitting: *Deleted

## 2011-09-14 NOTE — Progress Notes (Signed)
Physical Therapy Treatment Patient Details  Name: [name] MRN: [id number] Date of Birth: [birth year]  Today's Date: 09/14/2011 Time: 1232-1300 Time Calculation (min): 28 min 10 neuro re ed, 18 TE Visit#: 10  of 16   Re-eval: 10/04/11  Subjective: Symptoms/Limitations Symptoms: Patient reports it is a bad day.  She tried to switch meds last night for her sleeping and it didn't work.   Pain Assessment Currently in Pain?: No/denies Pain Score: 0-No pain Pain Location: Ankle  Exercise/Treatments Aerobic Stationary Bike: 6' 4.0 Standing Heel Raises: 15 reps;Limitations Heel Raises Limitations: cues to not rock back with hips, toe raises Lateral Step Up: 10 reps;Hand Hold: 2;Step Height: 4" Forward Step Up: 10 reps;Step Height: 4";Hand Hold: 2 Wall Squat: 10 reps SLS: 6" max of 5 trials SLS with Vectors: 5X each Other Standing Knee Exercises: BAPS L2 10X each way Other Standing Knee Exercises: balance beam 2RT with min hand held assist Seated Long Arc Quad: 10 reps;Right;Limitations Long Arc Quad Limitations: 5" holds  Ankle Exercises SLS: 6" max of 5 trials Heel Walk (Round Trip): 1RT Toe Walk (Round Trip):  (attempted , but unable secondary to pain and decreased ROM)  Physical Therapy Assessment and Plan  Clinical Impression Statement: Patient had pain with toe walking, I dont' beleve she has the range to be able to do this well.  Hold this exercise for now.   PT Plan: Hold toe walking (she can still do heel walking), add rocker board with 2 hand assist med/lat.  Try reciprocal steps with and without rails.      Problem List Patient Active Problem List  Diagnoses  . Borderline hypertension  . Anemia  . LVH (left ventricular hypertrophy)  . Tachycardia, paroxysmal  . Anxiety   PT - End of Session Activity Tolerance: Patient tolerated treatment well  Jacayla Nordell B. Jean Alejos, PT, DPT  09/14/2011, 1:09 PM

## 2011-09-19 ENCOUNTER — Ambulatory Visit (INDEPENDENT_AMBULATORY_CARE_PROVIDER_SITE_OTHER): Payer: Medicare Other | Admitting: Psychology

## 2011-09-19 ENCOUNTER — Ambulatory Visit (HOSPITAL_COMMUNITY)
Admission: RE | Admit: 2011-09-19 | Discharge: 2011-09-19 | Disposition: A | Discharge status: Home / Self Care | Payer: Medicare Other | Source: Ambulatory Visit | Attending: Family Medicine | Admitting: Family Medicine

## 2011-09-19 DIAGNOSIS — IMO0001 Reserved for inherently not codable concepts without codable children: Secondary | ICD-10-CM | POA: Insufficient documentation

## 2011-09-19 DIAGNOSIS — M25673 Stiffness of unspecified ankle, not elsewhere classified: Secondary | ICD-10-CM | POA: Insufficient documentation

## 2011-09-19 DIAGNOSIS — R262 Difficulty in walking, not elsewhere classified: Secondary | ICD-10-CM | POA: Insufficient documentation

## 2011-09-19 DIAGNOSIS — M25579 Pain in unspecified ankle and joints of unspecified foot: Secondary | ICD-10-CM | POA: Insufficient documentation

## 2011-09-19 DIAGNOSIS — F4310 Post-traumatic stress disorder, unspecified: Secondary | ICD-10-CM

## 2011-09-19 DIAGNOSIS — M6281 Muscle weakness (generalized): Secondary | ICD-10-CM | POA: Insufficient documentation

## 2011-09-19 DIAGNOSIS — M25676 Stiffness of unspecified foot, not elsewhere classified: Secondary | ICD-10-CM | POA: Insufficient documentation

## 2011-09-19 NOTE — Progress Notes (Signed)
Physical Therapy Treatment Patient Details  Name: [name] MRN: [id number] Date of Birth: 1927-10-26  Today's Date: 09/19/2011 Time: 4098-1191 Time Calculation (min): 44 min Visit#: 11  of 16   Re-eval: 10/04/11 Charges: Therex 34'  Subjective: Symptoms/Limitations Symptoms: Pt reports that she has been tired that last few days so she hasn't been able to do her exercises at home. Pain Assessment Currently in Pain?: No/denies Pain Score: 0-No pain   Exercise/Treatments Aerobic Stationary Bike: 6' 4.0 Standing Heel Raises: 20 reps Lateral Step Up: 15 reps;Hand Hold: 2;Step Height: 4" Forward Step Up: 15 reps;Hand Hold: 2;Step Height: 4" Wall Squat: 10 reps SLS: 4" max of 5 trials SLS with Vectors: 5X each w/1 HHA Other Standing Knee Exercises: BAPS L2 10X each way Other Standing Knee Exercises: balance beam 2RT with min hand held assist Seated Long Arc Quad: 10 reps;Right;Limitations Long Arc Quad Limitations: 5" holds  Physical Therapy Assessment and Plan PT Assessment and Plan Clinical Impression Statement: Pt displays good stability with exercises. Pt with good control with standing BAPS exercises. Pt required 3 rest breaks throughout session secondary to fatigue. Pt reports not increase in pain at end of session. PT Plan: Continue per PT POC. Add rocker board with 2 hand assist med/lat. Try reciprocal steps with and without rails as well.    Problem List Patient Active Problem List  Diagnoses  . Borderline hypertension  . Anemia  . LVH (left ventricular hypertrophy)  . Tachycardia, paroxysmal  . Anxiety    PT - End of Session Activity Tolerance: Patient tolerated treatment well General Behavior During Session: [facility] for tasks performed Cognition: [facility] for tasks performed  [name] 09/19/2011, 2:49 PM

## 2011-09-21 ENCOUNTER — Ambulatory Visit (HOSPITAL_COMMUNITY)
Admission: RE | Admit: 2011-09-21 | Discharge: 2011-09-21 | Disposition: A | Discharge status: Home / Self Care | Payer: Medicare Other | Source: Ambulatory Visit | Attending: Family Medicine | Admitting: Family Medicine

## 2011-09-21 NOTE — Progress Notes (Signed)
Physical Therapy Treatment Patient Details  Name: [name] MRN: [id number] Date of Birth: [birth year]  Today's Date: 09/21/2011 Time: 1478-2956 Time Calculation (min): 47 min Visit#: 12  of 16   Re-eval: 10/04/11  Charge: therex 40 min  Subjective: Symptoms/Limitations Symptoms: Pt reported no pain just really tired today. Pain Assessment Currently in Pain?: No/denies  Objective:   Exercise/Treatments Aerobic Stationary Bike: 6' 5.0 Standing Heel Raises: 20 reps;Limitations Heel Raises Limitations: toes raises 20 reps Lateral Step Up: 15 reps;Step Height: 4";Hand Hold: 1 Forward Step Up: 15 reps;Step Height: 4" Step Down: 15 reps;Step Height: 4";Hand Hold: 1 Wall Squat: 10 reps;5 seconds Stairs: 2 FL with 1 handrail Rocker Board: 2 minutes;Limitations Rocker Board Limitations: R/L with 2 HHA SLS: SLS L 20", R 3" max of 3 SLS with Vectors: 5x 5" with 1 finger A Other Standing Knee Exercises: BAPS L3 10X each way Other Standing Knee Exercises: balance beam 2RT with min hand held assist Seated Long Arc Quad: 15 reps;Weights;Limitations Long Arc Quad Weight: 3 lbs. Long [employer] Limitations: 5" holds    Physical Therapy Assessment and Plan PT Assessment and Plan Clinical Impression Statement: Began gait training with stairs, pt required 1 handrail assistance; able to complete reciprocally following vc-ing for proper tech.  Weak eccentric quad control presented with R LE.  Able to add 3# and increase reps with LAQ addressing quad weakness with visible fatigue shown, no c/o of any increased pain throuhout session.  Increased level on standing BAPS board with hips stabilitzed by therapist with good ankle strategy shown. PT Plan: Continue with current POC, progress R LE strength.    Goals    Problem List Patient Active Problem List  Diagnoses  . Borderline hypertension  . Anemia  . LVH (left ventricular hypertrophy)  . Tachycardia, paroxysmal  . Anxiety    PT - End  of Session Activity Tolerance: Patient tolerated treatment well General Behavior During Session: [facility] for tasks performed Cognition: [facility] for tasks performed  Juel Burrow 09/21/2011, 1:48 PM

## 2011-09-25 ENCOUNTER — Ambulatory Visit (HOSPITAL_COMMUNITY)
Admission: RE | Admit: 2011-09-25 | Discharge: 2011-09-25 | Disposition: A | Discharge status: Home / Self Care | Payer: Medicare Other | Source: Ambulatory Visit | Attending: Family Medicine | Admitting: Family Medicine

## 2011-09-25 NOTE — Progress Notes (Signed)
Physical Therapy Treatment Patient Details  Name: [name] MRN: [id number] Date of Birth: June 04, 1928  Today's Date: 09/25/2011 Time: 4098-1191 Time Calculation (min): 35 min Charge: 15 neuro re ed, 20 TE Visit#: 13  of 16   Re-eval: 10/04/11    Subjective: Symptoms/Limitations Symptoms: "I don't know if I over did it this week and today, but my left leg is very tired and my right ankle is achy." Pain Assessment Currently in Pain?: Yes Pain Score:   1 Pain Location: Ankle Pain Orientation: Right  Exercise/Treatments Aerobic Stationary Bike: 6' 5.0 Tread Mill: attempted treadmill secondary to bike was occupied, but patient didn't feel strong enough to stay on after 2 mins at 0.6 Standing Heel Raises: 20 reps;Limitations Heel Raises Limitations: toes raises 20 reps Lateral Step Up: 15 reps;Step Height: 4";Hand Hold: 1 Forward Step Up: 15 reps;Step Height: 4" Step Down: 15 reps;Step Height: 4";Hand Hold: 1 Wall Squat: 15 reps Rocker Board: 2 minutes;Limitations Rocker Board Limitations: R/L with 2 HHA SLS: SLS L 20", R 20" max of 3 (one finger bil hands to help stabilize.  ) SLS with Vectors: 5x 5" with 2 hand assist.   Other Standing Knee Exercises: BAPS L3 10X each way Other Standing Knee Exercises: balance beam 2RT with min hand held assist Seated Long Arc Quad: 15 reps;Weights;Limitations Long Arc Quad Weight: 3 lbs.  Physical Therapy Assessment and Plan  Clinical Impression Statement: Patient more fatigued today, but couldn't pinpoint why.  She had to take more sitting rest breaks.   PT Plan: Continue with current POC, add tandem stand for balance next treatment session.      Problem List Patient Active Problem List  Diagnoses  . Borderline hypertension  . Anemia  . LVH (left ventricular hypertrophy)  . Tachycardia, paroxysmal  . Anxiety    PT - End of Session Activity Tolerance: Patient tolerated treatment well;Patient limited by fatigue  Lindia Garms B.  Waynesha Rammel, PT, DPT  09/25/2011, 2:29 PM

## 2011-09-27 ENCOUNTER — Ambulatory Visit (HOSPITAL_COMMUNITY)
Admission: RE | Admit: 2011-09-27 | Discharge: 2011-09-27 | Disposition: A | Discharge status: Home / Self Care | Payer: Medicare Other | Source: Ambulatory Visit | Attending: Family Medicine | Admitting: Family Medicine

## 2011-09-27 NOTE — Progress Notes (Signed)
Physical Therapy Treatment Patient Details  Name: TEXANNA HILBURN MRN: [id number] Date of Birth: [birth year]  Today's Date: 09/27/2011 Time: 2841-3244 Time Calculation (min): 39 min Visit#: 14  of 16   Re-eval: 10/04/11 Charges: Therex x 38'  Subjective: Symptoms/Limitations Symptoms: "I went to the doctor yesterday and he said I don't need the cane unless I'm walking long distances or on uneven ground." (Pt ambulates into therapy with SPC but does not use it throughout session.) Pain Assessment Currently in Pain?: No/denies Pain Score: 0-No pain   Exercise/Treatments  Standing Heel Raises: 20 reps;Limitations Heel Raises Limitations: toes raises 20 reps Lateral Step Up: 20 reps;Step Height: 4";Right Forward Step Up: 20 reps;Step Height: 4";Right Step Down: 20 reps;Right;Step Height: 4" Wall Squat: 15 reps Rocker Board: 2 minutes;Limitations Rocker Board Limitations: R/L with 2 HHA SLS: SLS L15" R 22" w/1 finger assist SLS with Vectors: 5x 5" with 2 hand assist.   Other Standing Knee Exercises: BAPS L3 10X each way Other Standing Knee Exercises: balance beam 2RT with min hand held assist Seated Long Arc Quad: 20 reps Long Arc Quad Weight: 3 lbs.  Physical Therapy Assessment and Plan PT Assessment and Plan Clinical Impression Statement: Pt continues to appear fatigued. Pt reports that she does not have much of an appetite and she has not been eating much. Pt educated on importance of eating for energy and building muscles. Pt refused rec bike at end of session secondary to fatigue. Pt reports no increase in pain at end of session. PT Plan: Continue to progress per PT POC. Begin tandem stance next session.     Problem List Patient Active Problem List  Diagnoses  . Borderline hypertension  . Anemia  . LVH (left ventricular hypertrophy)  . Tachycardia, paroxysmal  . Anxiety    PT - End of Session Activity Tolerance: Patient tolerated treatment well General Behavior During  Session: [facility] for tasks performed Cognition: [facility] for tasks performed  Antonieta Iba 09/27/2011, 3:25 PM

## 2011-10-02 ENCOUNTER — Inpatient Hospital Stay (HOSPITAL_COMMUNITY): Admission: RE | Admit: 2011-10-02 | Payer: Medicare Other | Source: Ambulatory Visit | Admitting: *Deleted

## 2011-10-02 ENCOUNTER — Telehealth (HOSPITAL_COMMUNITY): Payer: Self-pay

## 2011-10-04 ENCOUNTER — Ambulatory Visit (HOSPITAL_COMMUNITY)
Admission: RE | Admit: 2011-10-04 | Discharge: 2011-10-04 | Disposition: A | Discharge status: Home / Self Care | Payer: Medicare Other | Source: Ambulatory Visit | Attending: Family Medicine | Admitting: Family Medicine

## 2011-10-04 NOTE — Progress Notes (Addendum)
Physical Therapy Treatment Patient Details  Name: [name] MRN: [id number] Date of Birth: October 19, 1927  Today's Date: 10/04/2011 Time: 4098-1191 Time Calculation (min): 39 min Visit#: 15  of 16   Re-eval: 10/04/11 Charges: MMT x 1 ROMM x 1 PPT x 8'  Subjective: Symptoms/Limitations Symptoms: I'm okay. I'm just tired. Pain Assessment Currently in Pain?: No/denies Pain Score: 0-No pain  Objective:  10/04/11 1406  Functional Tests  Functional Tests 49/80  RLE AROM (degrees)  Right Ankle Dorsiflexion 0-20 8   Right Ankle Plantar Flexion 0-45 40   Right Ankle Inversion 22   Right Ankle Eversion 12   RLE PROM (degrees)  Right Ankle Dorsiflexion 0-20 10   Right Ankle Plantar Flexion 0-45 46   Right Ankle Inversion 22   Right Ankle Eversion 20   RLE Strength  Right Ankle Dorsiflexion 5/5  Right Ankle Plantar Flexion 5/5  Right Ankle Inversion 5/5  Right Ankle Eversion 5/5  Static Standing Balance  Single Leg Stance - Right Leg 10   Single Leg Stance - Left Leg 10      Exercise/Treatments  Static Standing Balance Single Leg Stance - Right Leg: 10  Single Leg Stance - Left Leg: 10    Physical Therapy Assessment and Plan PT Assessment and Plan Clinical Impression Statement: Pt displays significant gains in ROM and strength. LEFS has improved 9 points. Pt reports that she feels comfortable continuing HEP at home. PT Plan: Recommend D/C to HEP to PT.    Goals Home Exercise Program Pt will Perform Home Exercise Program: Independently PT Goal: Perform Home Exercise Program - Progress: Met PT Short Term Goals Time to Complete Short Term Goals: 2 weeks PT Short Term Goal 1: Pt will improve her R LE ankle strength by 1 muscle grade.  PT Short Term Goal 1 - Progress: Met PT Short Term Goal 2: Pt will improve her R ankle AROM by 5 degrees in each direction.  PT Short Term Goal 2 - Progress: Met PT Short Term Goal 3: Pt will ambulate independently x5 minutes without an  increase in pain.  PT Short Term Goal 3 - Progress: Met PT Short Term Goal 4: Pt will demonstrate R SLS x10 sec static surface. PT Short Term Goal 4 - Progress: Met PT Long Term Goals Time to Complete Long Term Goals: 4 weeks PT Long Term Goal 1: Pt will improve R ankle AROM to WNL in order to continue with driving acitivities to decrease need of home health aides.  PT Long Term Goal 1 - Progress: Partly met (AROM is WNL but pt has not began driving.) PT Long Term Goal 2: Pt will improve R ankle strength to WNL in order to ascend and descend 10 stairs with 1 handrail to safely enter community areas.  PT Long Term Goal 2 - Progress: Met Long Term Goal 3: Pt will improve her activity tolerance and report ability to ambulate independently x15 minutes with reports of mild pain to continue with community activities.  Long Term Goal 3 Progress: Met Long Term Goal 4: Pt will improve balance and demonstrate ambulating on a dynamic surface independently x100 ft to continue safely with outdoor household activities.  Long Term Goal 4 Progress: Met PT Long Term Goal 5: Pt will improve her LEFS by 9 points for improved percieved functional ability.  Long Term Goal 5 Progress: Met  Problem List Patient Active Problem List  Diagnoses  . Borderline hypertension  . Anemia  . LVH (left ventricular  hypertrophy)  . Tachycardia, paroxysmal  . Anxiety    PT - End of Session Activity Tolerance: Patient tolerated treatment well General Behavior During Session: [facility] for tasks performed Cognition: [facility] for tasks performed  Seth Bake Leah/ Lurena Joiner B. Marlow Hendrie, PT, DPT   10/04/2011, 2:46 PM

## 2011-10-08 ENCOUNTER — Ambulatory Visit (INDEPENDENT_AMBULATORY_CARE_PROVIDER_SITE_OTHER): Payer: Medicare Other | Admitting: Psychology

## 2011-10-08 DIAGNOSIS — F4310 Post-traumatic stress disorder, unspecified: Secondary | ICD-10-CM

## 2011-10-18 ENCOUNTER — Encounter: Payer: Self-pay | Admitting: Cardiology

## 2011-10-18 ENCOUNTER — Ambulatory Visit (INDEPENDENT_AMBULATORY_CARE_PROVIDER_SITE_OTHER): Payer: Medicare Other | Admitting: Cardiology

## 2011-10-18 DIAGNOSIS — E785 Hyperlipidemia, unspecified: Secondary | ICD-10-CM | POA: Insufficient documentation

## 2011-10-18 DIAGNOSIS — I479 Paroxysmal tachycardia, unspecified: Secondary | ICD-10-CM

## 2011-10-18 DIAGNOSIS — E782 Mixed hyperlipidemia: Secondary | ICD-10-CM | POA: Insufficient documentation

## 2011-10-18 DIAGNOSIS — R030 Elevated blood-pressure reading, without diagnosis of hypertension: Secondary | ICD-10-CM

## 2011-10-18 NOTE — Progress Notes (Signed)
Patient ID: [name], female   DOB: 1928-07-18, 78 y.o.   MRN: [id number] HPI: Scheduled return visit for continuing assessment and treatment of hypertension and anxiety.  Since her last office appointment, [name] has visited with Dr. Dellia Cloud on 2 occasions.  She is using very low dose Ativan in addition to low dose beta blocker with benefit.  She still notes increased anxiety and anorexia with modest weight loss, but is less anxious overall, more active and is sleeping well.  She has not experienced any significant tachypalpitations.  Prior to Admission medications   Medication Sig Start Date End Date Taking? Authorizing Provider  atenolol (TENORMIN) 50 MG tablet Take 1/2 tablet each am 08/17/11  Yes Gerrit Friends. Jermarion Poffenberger, MD  calcium-vitamin D (OSCAL WITH D) 500-200 MG-UNIT per tablet Take 1 tablet by mouth daily.     Yes Historical Provider, MD  lisinopril (PRINIVIL,ZESTRIL) 10 MG tablet Take 1 tablet (10 mg total) by mouth daily. 08/17/11  Yes Gerrit Friends. Brynlie Daza, MD  LORazepam (ATIVAN) 0.5 MG tablet Take 0.5 mg by mouth every 8 (eight) hours. 1/2 tab Q pm and 1/4 tab Qam   Yes David L. Gutterman, PHD  MAGNESIUM OXIDE PO Take 1 tablet by mouth daily.     Yes Historical Provider, MD  Multiple Vitamin (MULTIVITAMIN) capsule Take 1 capsule by mouth daily.     Yes Historical Provider, MD    Allergies  Allergen Reactions  . Alprazolam Er   . Caffeine       Past medical history, social history, and family history reviewed and updated.  ROS: She denies chest discomfort, exertional dyspnea, lightheadedness or syncope.  She has not suffered any falls.  Ambulation has improved, and she has progressed from a walker to a cane.  PHYSICAL EXAM: BP 135/80  Pulse 83  Ht 5' (1.524 m)  Wt 45.36 kg (100 lb)  BMI 19.53 kg/m2  General-Well developed; no acute distress Body habitus-underweight, but with much of her adiposity in a central distribution Neck-No JVD, no carotid bruits Lungs: clear lung  fields; normal I:E ratio Cardiovascular-normal PMI; normal S1 and S2; grade 2-3/6 early to mid peaking systolic murmur at the cardiac base Abdomen-normal bowel sounds; soft and non-tender without masses or organomegaly Skin-Warm, no significant lesions Extremities-Nl distal pulses; no edema  ASSESSMENT AND PLAN:  Lawler Bing, MD 10/18/2011 1:24 PM

## 2011-10-18 NOTE — Patient Instructions (Signed)
Your physician recommends that you schedule a follow-up appointment in: 9 months  

## 2011-10-18 NOTE — Assessment & Plan Note (Signed)
Blood pressures improved with low-dose beta blocker.  Patient verifies that the values obtained at home have been less than 140 systolic.  She will continue current atenolol dosage.

## 2011-10-18 NOTE — Assessment & Plan Note (Signed)
No further symptoms of late. This problem is either due to adrenergic stimulation, which has been adequately treated with low-dose beta blocker or to anxiety, which has also improved.

## 2011-11-01 ENCOUNTER — Ambulatory Visit (INDEPENDENT_AMBULATORY_CARE_PROVIDER_SITE_OTHER): Payer: Medicare Other | Admitting: Psychology

## 2011-11-01 DIAGNOSIS — F4310 Post-traumatic stress disorder, unspecified: Secondary | ICD-10-CM

## 2011-11-05 ENCOUNTER — Telehealth: Payer: Self-pay | Admitting: Adult Health

## 2011-11-05 NOTE — Telephone Encounter (Signed)
**Note De-Identified Damoney Julia Obfuscation** Pt. is advised that a normal HR is 60 to 100 bpm. Pt. Is concerned that her HR is 62 bpm at this time and she states this is lower than normal for her. Pt. Is advised to call office is her HR falls consistently below 60 bpm. She verbalized understanding./LV

## 2011-11-05 NOTE — Telephone Encounter (Signed)
Would like return phone call regarding what her heart rate should be. / tg

## 2011-11-28 ENCOUNTER — Ambulatory Visit (INDEPENDENT_AMBULATORY_CARE_PROVIDER_SITE_OTHER): Payer: Medicare Other | Admitting: Psychology

## 2011-11-28 DIAGNOSIS — F4310 Post-traumatic stress disorder, unspecified: Secondary | ICD-10-CM

## 2011-12-28 ENCOUNTER — Ambulatory Visit: Payer: Medicare Other | Admitting: Psychology

## 2012-01-04 ENCOUNTER — Ambulatory Visit (INDEPENDENT_AMBULATORY_CARE_PROVIDER_SITE_OTHER): Payer: Medicare Other | Admitting: Psychology

## 2012-01-04 DIAGNOSIS — F4310 Post-traumatic stress disorder, unspecified: Secondary | ICD-10-CM

## 2012-02-01 ENCOUNTER — Ambulatory Visit: Payer: Medicare Other | Admitting: Psychology

## 2012-06-25 ENCOUNTER — Encounter (HOSPITAL_COMMUNITY): Payer: Self-pay

## 2012-06-27 ENCOUNTER — Ambulatory Visit (INDEPENDENT_AMBULATORY_CARE_PROVIDER_SITE_OTHER): Payer: Medicare Other | Admitting: Cardiology

## 2012-06-27 ENCOUNTER — Telehealth: Payer: Self-pay | Admitting: Cardiology

## 2012-06-27 ENCOUNTER — Encounter: Payer: Self-pay | Admitting: Cardiology

## 2012-06-27 ENCOUNTER — Encounter: Payer: Self-pay | Admitting: *Deleted

## 2012-06-27 VITALS — BP 160/86 | HR 78 | Ht 60.0 in | Wt 102.1 lb

## 2012-06-27 DIAGNOSIS — F411 Generalized anxiety disorder: Secondary | ICD-10-CM

## 2012-06-27 DIAGNOSIS — D649 Anemia, unspecified: Secondary | ICD-10-CM | POA: Insufficient documentation

## 2012-06-27 DIAGNOSIS — F419 Anxiety disorder, unspecified: Secondary | ICD-10-CM

## 2012-06-27 DIAGNOSIS — I10 Essential (primary) hypertension: Secondary | ICD-10-CM

## 2012-06-27 MED ORDER — LISINOPRIL 20 MG PO TABS: 20.0000 mg | ORAL_TABLET | Freq: Every day | ORAL | Status: DC

## 2012-06-27 NOTE — Assessment & Plan Note (Signed)
Blood pressure control is fairly good, but not optimal.  Lisinopril dosage will be increased to 20 mg per day.  Patient will continue to record frequent BP measurements and return those to [place] in one month.  Appropriate laboratory studies will be obtained at that time as well.

## 2012-06-27 NOTE — Telephone Encounter (Signed)
Wanted to clarify dose of lisinopril.  Advised her to take the prescribed 20 mg.

## 2012-06-27 NOTE — Telephone Encounter (Signed)
Patient called and states she is unclear on medication instructions.  Advised RN in clinic, but she would call her back before end of day today.  Patient understood.

## 2012-06-27 NOTE — Progress Notes (Deleted)
Name: [name]    DOB: 1927/11/14  Age: 78 y.o.  MR#: [id number]       PCP:  Kirk Ruths, MD      Insurance: @PAYORNAME @   CC:   No chief complaint on file.  MEDICATION BOTTLES REVIEWED  VS BP 160/86  Pulse 78  Ht 5' (1.524 m)  Wt 102 lb 1.9 oz (46.321 kg)  BMI 19.94 kg/m2  SpO2 98%  Weights Current Weight  06/27/12 102 lb 1.9 oz (46.321 kg)  10/18/11 100 lb (45.36 kg)  08/17/11 102 lb (46.267 kg)    Blood Pressure  BP Readings from Last 3 Encounters:  06/27/12 160/86  10/18/11 135/80  08/17/11 183/92     Admit date:  (Not on file) Last encounter with RMR:  Visit date not found   Allergy Allergies  Allergen Reactions  . Alprazolam Other (See Comments)    "puts me to sleep for days"  . Caffeine Other (See Comments)    "peps me up"    Current Outpatient Prescriptions  Medication Sig Dispense Refill  . atenolol (TENORMIN) 50 MG tablet Take 1/2 tablet each am  90 tablet  3  . lisinopril (PRINIVIL,ZESTRIL) 10 MG tablet Take 1 tablet (10 mg total) by mouth daily.  90 tablet  3  . LORazepam (ATIVAN) 0.5 MG tablet Take 0.125 mg by mouth every 8 (eight) hours. Take 1/4 tablet as needed for nerves      . Multiple Vitamin (MULTIVITAMIN WITH MINERALS) TABS Take 1 tablet by mouth daily.        Discontinued Meds:   There are no discontinued medications.  Patient Active Problem List  Diagnosis  . Borderline hypertension  . LVH (left ventricular hypertrophy)  . Tachycardia, paroxysmal  . Anxiety  . Hyperlipidemia    LABS No visits with results within 3 Month(s) from this visit. Latest known visit with results is:  Admission on 05/10/2011, Discharged on 05/14/2011  Component Date Value  . WBC 05/11/2011 11.1*  . RBC 05/11/2011 3.75*  . Hemoglobin 05/11/2011 11.7*  . HCT 05/11/2011 34.8*  . MCV 05/11/2011 92.8   . [facility] 05/11/2011 31.2   . MCHC 05/11/2011 33.6   . RDW 05/11/2011 12.9   . Platelets 05/11/2011 204   . Prothrombin Time 05/11/2011 13.8   . INR  05/11/2011 1.04   . Sodium 05/11/2011 139   . Potassium 05/11/2011 4.2   . Chloride 05/11/2011 106   . CO2 05/11/2011 25   . Glucose, Bld 05/11/2011 130*  . BUN 05/11/2011 15   . Creatinine, Ser 05/11/2011 0.52   . Calcium 05/11/2011 8.9   . GFR calc non Af Amer 05/11/2011 >60   . GFR calc Af Amer 05/11/2011 >60   . WBC 05/12/2011 10.5   . RBC 05/12/2011 3.60*  . Hemoglobin 05/12/2011 11.5*  . HCT 05/12/2011 33.7*  . MCV 05/12/2011 93.6   . [facility] 05/12/2011 31.9   . MCHC 05/12/2011 34.1   . RDW 05/12/2011 13.1   . Platelets 05/12/2011 179   . Sodium 05/12/2011 140   . Potassium 05/12/2011 4.0   . Chloride 05/12/2011 106   . CO2 05/12/2011 29   . Glucose, Bld 05/12/2011 110*  . BUN 05/12/2011 13   . Creatinine, Ser 05/12/2011 0.53   . Calcium 05/12/2011 9.0   . GFR calc non Af Amer 05/12/2011 >60   . GFR calc Af Amer 05/12/2011 >60   . Prothrombin Time 05/12/2011 14.5   . INR 05/12/2011 1.11   .  Prothrombin Time 05/13/2011 14.6   . INR 05/13/2011 1.12   . Prothrombin Time 05/14/2011 21.7*  . INR 05/14/2011 1.85*     Results for this Opt Visit:     Results for orders placed during the hospital encounter of 05/10/11  CBC      Component Value Range   WBC 11.1 (*) 4.0 - 10.5 K/uL   RBC 3.75 (*) 3.87 - 5.11 MIL/uL   Hemoglobin 11.7 (*) 12.0 - 15.0 g/dL   HCT 29.5 (*) 62.1 - 30.8 %   MCV 92.8  78.0 - 100.0 fL   MCH 31.2  26.0 - 34.0 pg   MCHC 33.6  30.0 - 36.0 g/dL   RDW 65.7  84.6 - 96.2 %   Platelets 204  150 - 400 K/uL  PROTIME-INR      Component Value Range   Prothrombin Time 13.8  11.6 - 15.2 seconds   INR 1.04  0.00 - 1.49  BASIC METABOLIC PANEL      Component Value Range   Sodium 139  135 - 145 mEq/L   Potassium 4.2  3.5 - 5.1 mEq/L   Chloride 106  96 - 112 mEq/L   CO2 25  19 - 32 mEq/L   Glucose, Bld 130 (*) 70 - 99 mg/dL   BUN 15  6 - 23 mg/dL   Creatinine, Ser 9.52  0.50 - 1.10 mg/dL   Calcium 8.9  8.4 - 84.1 mg/dL   GFR calc non Af Amer >60  >60  mL/min   GFR calc Af Amer >60  >60 mL/min  CBC      Component Value Range   WBC 10.5  4.0 - 10.5 K/uL   RBC 3.60 (*) 3.87 - 5.11 MIL/uL   Hemoglobin 11.5 (*) 12.0 - 15.0 g/dL   HCT 32.4 (*) 40.1 - 02.7 %   MCV 93.6  78.0 - 100.0 fL   MCH 31.9  26.0 - 34.0 pg   MCHC 34.1  30.0 - 36.0 g/dL   RDW 25.3  66.4 - 40.3 %   Platelets 179  150 - 400 K/uL  BASIC METABOLIC PANEL      Component Value Range   Sodium 140  135 - 145 mEq/L   Potassium 4.0  3.5 - 5.1 mEq/L   Chloride 106  96 - 112 mEq/L   CO2 29  19 - 32 mEq/L   Glucose, Bld 110 (*) 70 - 99 mg/dL   BUN 13  6 - 23 mg/dL   Creatinine, Ser 4.74  0.50 - 1.10 mg/dL   Calcium 9.0  8.4 - 25.9 mg/dL   GFR calc non Af Amer >60  >60 mL/min   GFR calc Af Amer >60  >60 mL/min  PROTIME-INR      Component Value Range   Prothrombin Time 14.5  11.6 - 15.2 seconds   INR 1.11  0.00 - 1.49  PROTIME-INR      Component Value Range   Prothrombin Time 14.6  11.6 - 15.2 seconds   INR 1.12  0.00 - 1.49  PROTIME-INR      Component Value Range   Prothrombin Time 21.7 (*) 11.6 - 15.2 seconds   INR 1.85 (*) 0.00 - 1.49    EKG Orders placed in visit on 08/20/11  . CARDIAC EVENT MONITOR     Prior Assessment and Plan Problem List as of 06/27/2012            Cardiology Problems   Tachycardia, paroxysmal  Last Assessment & Plan Note   10/18/2011 Office Visit Signed 10/18/2011  1:28 PM by Kathlen Brunswick, MD    No further symptoms of late. This problem is either due to adrenergic stimulation, which has been adequately treated with low-dose beta blocker or to anxiety, which has also improved.    LVH (left ventricular hypertrophy)   Last Assessment & Plan Note   05/31/2011 Office Visit Addendum 06/07/2011  2:46 PM by Kathlen Brunswick, MD    Patient has evidence for previous inferior MI on her EKG, but this apparently was not verified by her echocardiogram.  She has mild LVH with HOCM physiology, which may have reflected high adrenergic tone at tet  time her echocardiogram was performed.  I will review the study and reassess this nice woman in 6 months; however, for now she does not appear to require additional testing or any pharmacologic therapy.    Hyperlipidemia     Other   Borderline hypertension   Last Assessment & Plan Note   10/18/2011 Office Visit Signed 10/18/2011  1:28 PM by Kathlen Brunswick, MD    Blood pressures improved with low-dose beta blocker.  Patient verifies that the values obtained at home have been less than 140 systolic.  She will continue current atenolol dosage.    Anxiety   Last Assessment & Plan Note   08/17/2011 Office Visit Addendum 08/18/2011  7:44 PM by Kathlen Brunswick, MD    Patient is perseverating on her anxieties and generally appears to be suffering significant mental distress.  She was quite resistant to my suggestion that she consider psychotherapy or at least evaluation by a psychologist, but her daughter warmly welcomed the suggestion and insisted that an appointment be scheduled.  Daughter was familiar with Dr. Dellia Cloud and was eager that he meet with her mother.        Imaging: No results found.   FRS Calculation: Score not calculated

## 2012-06-27 NOTE — Assessment & Plan Note (Signed)
Patient was mildly anemic when last assessed in 2012.  She reports a negative screening colonoscopy approximately 8 years ago and no evidence for occult GI blood loss.  A repeat CBC and stool for Hemoccult testing will be obtained.

## 2012-06-27 NOTE — Patient Instructions (Addendum)
Your physician recommends that you schedule a follow-up appointment in: 10 months  Your physician has recommended you make the following change in your medication:  1 - INCREASE Lisinopril to 20 mg daily  Your physician has requested that you regularly monitor and record your blood pressure readings at home. Please use the same machine at the same time of day to check your readings and record them to bring to your follow-up visit.  Your physician recommends that you return for lab work in: 1 month (you will receive a letter)  Stool cards x 3 and return to office

## 2012-06-27 NOTE — Assessment & Plan Note (Signed)
Anxiety is relatively well controlled with some assistance at home and low dose alprazolam therapy on an as-needed basis.  I encouraged [name] to seek Dr. Dawayne Cirri help again should symptoms recur.

## 2012-06-27 NOTE — Progress Notes (Signed)
Patient ID: [name], female   DOB: 1928/03/16, 78 y.o.   MRN: [id number]  HPI: Scheduled return visit for this lovely lady who I follow for palpitations and hypertension.  Since her last visit, she has improved substantially.  She was evaluated by Dr. Dellia [name] on 2 occasions and treated with low dose alprazolam with good results.  Due to transportation issues, she has been unable to arrange for subsequent appointments.  Anxiety has been significant, but substantially improved.  She is currently only using medication occasionally.  There is no prominent sleep disturbance.  Weight and appetite have been stable.  Caring for her husband, who has Parkinson's, continues to place a strain on her.  She has experienced no additional episodes of palpitations and attributes past spells to stress.  Patient provides a list of approximately 100 blood pressure determinations over the past few months.  In May and June, nearly all were less than 140/80.  For the past 2 months, approximately 25% of the values Fall between 140-175 systolic with a rare diastolic in the 90s.  Prior to Admission medications   Medication Sig Start Date End Date Taking? Authorizing Provider  atenolol (TENORMIN) 50 MG tablet Take 1/2 tablet each am 08/17/11  Yes Terri Brunswick, MD  lisinopril (PRINIVIL,ZESTRIL) 20 MG tablet Take 1 tablet (20 mg total) by mouth daily. 06/27/12  Yes Terri Brunswick, MD  LORazepam (ATIVAN) 0.5 MG tablet Take 0.125 mg by mouth every 8 (eight) hours. Take 1/4 tablet as needed for nerves   Yes Terri Rushing, PHD  Multiple Vitamin (MULTIVITAMIN WITH MINERALS) TABS Take 1 tablet by mouth daily.   Yes Historical Provider, MD   Allergies  Allergen Reactions  . Alprazolam Other (See Comments)    "puts me to sleep for days"  . Caffeine Other (See Comments)    "peps me up"  Past medical history, social history, and family history reviewed and updated.  ROS: Denies chest pain, dyspnea, orthopnea, PND or  pedal edema.  All other systems reviewed and are negative.  PHYSICAL EXAM: BP 160/86  Pulse 78  Ht 5' (1.524 m)  Wt 46.321 kg (102 lb 1.9 oz)  BMI 19.94 kg/m2  SpO2 98%  General-Well developed; no acute distress Body habitus-proportionate weight and height Neck-No JVD; no carotid bruits Lungs-clear lung fields; resonant to percussion; mild kyphosis Cardiovascular-normal PMI; normal S1 and S2; modest systolic ejection murmur Abdomen-normal bowel sounds; soft and non-tender without masses or organomegaly Musculoskeletal-No deformities, no cyanosis or clubbing Neurologic-Normal cranial nerves; symmetric strength and tone Skin-Warm, no significant lesions Extremities-Normal posterior tibial pulses; dorsalis pedis pulses are decreased; no edema  ASSESSMENT AND PLAN:  Terri Bing, MD 06/27/2012 1:25 PM

## 2012-07-01 ENCOUNTER — Encounter: Payer: Self-pay | Admitting: Cardiology

## 2012-07-01 NOTE — Patient Instructions (Addendum)
Your procedure is scheduled on: 07/08/2012  Report to [facility] at  630       AM.  Call this number if you have problems the morning of surgery: [phone]   Do not eat food or drink liquids :After Midnight.      Take these medicines the morning of surgery with A SIP OF WATER:atenolol,lisinopril,ativan   Do not wear jewelry, make-up or nail polish.  Do not wear lotions, powders, or perfumes. You may wear deodorant.  Do not shave 48 hours prior to surgery.  Do not bring valuables to the hospital.  Contacts, dentures or bridgework may not be worn into surgery.  Leave suitcase in the car. After surgery it may be brought to your room.  For patients admitted to the hospital, checkout time is 11:00 AM the day of discharge.   Patients discharged the day of surgery will not be allowed to drive home.  :     Please read over the following fact sheets that you were given: Coughing and Deep Breathing, Surgical Site Infection Prevention, Anesthesia Post-op Instructions and Care and Recovery After Surgery    Cataract A cataract is a clouding of the lens of the eye. When a lens becomes cloudy, vision is reduced based on the degree and nature of the clouding. Many cataracts reduce vision to some degree. Some cataracts make people more near-sighted as they develop. Other cataracts increase glare. Cataracts that are ignored and become worse can sometimes look white. The white color can be seen through the pupil. CAUSES   Aging. However, cataracts may occur at any age, even in newborns.   Certain drugs.   Trauma to the eye.   Certain diseases such as diabetes.   Specific eye diseases such as chronic inflammation inside the eye or a sudden attack of a rare form of glaucoma.   Inherited or acquired medical problems.  SYMPTOMS   Gradual, progressive drop in vision in the affected eye.   Severe, rapid visual loss. This most often happens when trauma is the cause.  DIAGNOSIS  To detect a  cataract, an eye doctor examines the lens. Cataracts are best diagnosed with an exam of the eyes with the pupils enlarged (dilated) by drops.  TREATMENT  For an early cataract, vision may improve by using different eyeglasses or stronger lighting. If that does not help your vision, surgery is the only effective treatment. A cataract needs to be surgically removed when vision loss interferes with your everyday activities, such as driving, reading, or watching TV. A cataract may also have to be removed if it prevents examination or treatment of another eye problem. Surgery removes the cloudy lens and usually replaces it with a substitute lens (intraocular lens, IOL).  At a time when both you and your doctor agree, the cataract will be surgically removed. If you have cataracts in both eyes, only one is usually removed at a time. This allows the operated eye to heal and be out of danger from any possible problems after surgery (such as infection or poor wound healing). In rare cases, a cataract may be doing damage to your eye. In these cases, your caregiver may advise surgical removal right away. The vast majority of people who have cataract surgery have better vision afterward. HOME CARE INSTRUCTIONS  If you are not planning surgery, you may be asked to do the following:  Use different eyeglasses.   Use stronger or brighter lighting.   Ask your eye doctor about reducing  your medicine dose or changing medicines if it is thought that a medicine caused your cataract. Changing medicines does not make the cataract go away on its own.   Become familiar with your surroundings. Poor vision can lead to injury. Avoid bumping into things on the affected side. You are at a higher risk for tripping or falling.   Exercise extreme care when driving or operating machinery.   Wear sunglasses if you are sensitive to bright light or experiencing problems with glare.  SEEK IMMEDIATE MEDICAL CARE IF:   You have a  worsening or sudden vision loss.   You notice redness, swelling, or increasing pain in the eye.   You have a fever.  Document Released: 09/03/2005 Document Revised: 08/23/2011 Document Reviewed: 04/27/2011 [facility] Patient Information 2012 [place], [place].PATIENT INSTRUCTIONS POST-ANESTHESIA  IMMEDIATELY FOLLOWING SURGERY:  Do not drive or operate machinery for the first twenty four hours after surgery.  Do not make any important decisions for twenty four hours after surgery or while taking narcotic pain medications or sedatives.  If you develop intractable nausea and vomiting or a severe headache please notify your doctor immediately.  FOLLOW-UP:  Please make an appointment with your surgeon as instructed. You do not need to follow up with anesthesia unless specifically instructed to do so.  WOUND CARE INSTRUCTIONS (if applicable):  Keep a dry clean dressing on the anesthesia/puncture wound site if there is drainage.  Once the wound has quit draining you may leave it open to air.  Generally you should leave the bandage intact for twenty four hours unless there is drainage.  If the epidural site drains for more than 36-48 hours please call the anesthesia department.  QUESTIONS?:  Please feel free to call your physician or the hospital operator if you have any questions, and they will be happy to assist you.

## 2012-07-02 ENCOUNTER — Encounter (HOSPITAL_COMMUNITY): Payer: Self-pay

## 2012-07-02 ENCOUNTER — Encounter (HOSPITAL_COMMUNITY)
Admission: RE | Admit: 2012-07-02 | Discharge: 2012-07-02 | Disposition: A | Discharge status: Home / Self Care | Payer: Medicare Other | Source: Ambulatory Visit | Attending: Ophthalmology | Admitting: Ophthalmology

## 2012-07-02 HISTORY — DX: Unspecified osteoarthritis, unspecified site: M19.90

## 2012-07-02 LAB — HEMOGLOBIN AND HEMATOCRIT, BLOOD
HCT: 45.4 % (ref 36.0–46.0)
Hemoglobin: 15.4 g/dL — ABNORMAL HIGH (ref 12.0–15.0)

## 2012-07-02 MED ORDER — FLURBIPROFEN SODIUM 0.03 % OP SOLN: 1.0000 [drp] | OPHTHALMIC | Status: DC

## 2012-07-02 MED ORDER — CYCLOPENTOLATE HCL 1 % OP SOLN: 1.0000 [drp] | OPHTHALMIC | Status: DC

## 2012-07-02 MED ORDER — LIDOCAINE HCL 3.5 % OP GEL: 1.0000 "application " | Freq: Once | OPHTHALMIC | Status: DC

## 2012-07-02 MED ORDER — TETRACAINE HCL 0.5 % OP SOLN: 1.0000 [drp] | OPHTHALMIC | Status: DC

## 2012-07-02 MED ORDER — PHENYLEPHRINE HCL 2.5 % OP SOLN: 1.0000 [drp] | OPHTHALMIC | Status: DC

## 2012-07-02 MED ORDER — CYCLOPENTOLATE-PHENYLEPHRINE 0.2-1 % OP SOLN: 1.0000 [drp] | OPHTHALMIC | Status: DC

## 2012-07-07 MED ORDER — FLURBIPROFEN SODIUM 0.03 % OP SOLN
OPHTHALMIC | Status: AC
  Filled 2012-07-07: qty 2.5

## 2012-07-07 MED ORDER — CYCLOPENTOLATE-PHENYLEPHRINE 0.2-1 % OP SOLN
OPHTHALMIC | Status: AC
  Filled 2012-07-07: qty 2

## 2012-07-07 MED ORDER — TETRACAINE HCL 0.5 % OP SOLN
OPHTHALMIC | Status: AC
  Filled 2012-07-07: qty 2

## 2012-07-07 MED ORDER — PHENYLEPHRINE HCL 2.5 % OP SOLN
OPHTHALMIC | Status: AC
  Filled 2012-07-07: qty 2

## 2012-07-08 ENCOUNTER — Encounter (HOSPITAL_COMMUNITY)
Admission: RE | Disposition: A | Discharge status: Home / Self Care | Payer: Self-pay | Source: Ambulatory Visit | Attending: Ophthalmology

## 2012-07-08 ENCOUNTER — Encounter (HOSPITAL_COMMUNITY): Payer: Self-pay | Admitting: Anesthesiology

## 2012-07-08 ENCOUNTER — Encounter (HOSPITAL_COMMUNITY): Payer: Self-pay | Admitting: *Deleted

## 2012-07-08 ENCOUNTER — Ambulatory Visit (HOSPITAL_COMMUNITY)
Admission: RE | Admit: 2012-07-08 | Discharge: 2012-07-08 | Disposition: A | Discharge status: Home / Self Care | Payer: Medicare Other | Source: Ambulatory Visit | Attending: Ophthalmology | Admitting: Ophthalmology

## 2012-07-08 ENCOUNTER — Ambulatory Visit (HOSPITAL_COMMUNITY): Payer: Medicare Other | Admitting: Anesthesiology

## 2012-07-08 DIAGNOSIS — Z01812 Encounter for preprocedural laboratory examination: Secondary | ICD-10-CM | POA: Insufficient documentation

## 2012-07-08 DIAGNOSIS — H2510 Age-related nuclear cataract, unspecified eye: Secondary | ICD-10-CM | POA: Insufficient documentation

## 2012-07-08 DIAGNOSIS — I10 Essential (primary) hypertension: Secondary | ICD-10-CM | POA: Insufficient documentation

## 2012-07-08 HISTORY — PX: CATARACT EXTRACTION W/PHACO: SHX586

## 2012-07-08 LAB — POCT I-STAT 4, (NA,K, GLUC, HGB,HCT)
Glucose, Bld: 102 mg/dL — ABNORMAL HIGH (ref 70–99)
Potassium: 4.1 mEq/L (ref 3.5–5.1)

## 2012-07-08 SURGERY — PHACOEMULSIFICATION, CATARACT, WITH IOL INSERTION
Anesthesia: Monitor Anesthesia Care | Site: Eye | Laterality: Left | Wound class: Clean

## 2012-07-08 MED ORDER — ONDANSETRON HCL 4 MG/2ML IJ SOLN: 4.0000 mg | Freq: Once | INTRAMUSCULAR | Status: DC | PRN

## 2012-07-08 MED ORDER — PHENYLEPHRINE HCL 2.5 % OP SOLN
1.0000 [drp] | OPHTHALMIC | Status: AC
  Administered 2012-07-08 (×3): 1 [drp] via OPHTHALMIC

## 2012-07-08 MED ORDER — PROVISC 10 MG/ML IO SOLN
INTRAOCULAR | Status: DC | PRN
  Administered 2012-07-08: 8.5 mg via INTRAOCULAR

## 2012-07-08 MED ORDER — FENTANYL CITRATE 0.05 MG/ML IJ SOLN: 25.0000 ug | INTRAMUSCULAR | Status: DC | PRN

## 2012-07-08 MED ORDER — TETRACAINE HCL 0.5 % OP SOLN
1.0000 [drp] | OPHTHALMIC | Status: AC
  Administered 2012-07-08 (×3): 1 [drp] via OPHTHALMIC

## 2012-07-08 MED ORDER — MIDAZOLAM HCL 2 MG/2ML IJ SOLN
INTRAMUSCULAR | Status: AC
  Filled 2012-07-08: qty 2

## 2012-07-08 MED ORDER — EPINEPHRINE HCL 1 MG/ML IJ SOLN
INTRAOCULAR | Status: DC | PRN
  Administered 2012-07-08: 08:00:00

## 2012-07-08 MED ORDER — MIDAZOLAM HCL 2 MG/2ML IJ SOLN
1.0000 mg | INTRAMUSCULAR | Status: DC | PRN
  Administered 2012-07-08: 2 mg via INTRAVENOUS

## 2012-07-08 MED ORDER — EPINEPHRINE HCL 1 MG/ML IJ SOLN
INTRAMUSCULAR | Status: AC
  Filled 2012-07-08: qty 1

## 2012-07-08 MED ORDER — LIDOCAINE HCL 3.5 % OP GEL: 1.0000 "application " | Freq: Once | OPHTHALMIC | Status: DC

## 2012-07-08 MED ORDER — LACTATED RINGERS IV SOLN
INTRAVENOUS | Status: DC
  Administered 2012-07-08: 1000 mL via INTRAVENOUS

## 2012-07-08 MED ORDER — BSS IO SOLN
INTRAOCULAR | Status: DC | PRN
  Administered 2012-07-08: 15 mL via INTRAOCULAR

## 2012-07-08 MED ORDER — CYCLOPENTOLATE HCL 1 % OP SOLN: 1.0000 [drp] | OPHTHALMIC | Status: DC

## 2012-07-08 MED ORDER — CYCLOPENTOLATE-PHENYLEPHRINE 0.2-1 % OP SOLN
1.0000 [drp] | OPHTHALMIC | Status: AC
  Administered 2012-07-08 (×3): 1 [drp] via OPHTHALMIC

## 2012-07-08 MED ORDER — FLURBIPROFEN SODIUM 0.03 % OP SOLN
1.0000 [drp] | OPHTHALMIC | Status: AC
  Administered 2012-07-08 (×3): 1 [drp] via OPHTHALMIC

## 2012-07-08 SURGICAL SUPPLY — 23 items

## 2012-07-08 NOTE — Anesthesia Preprocedure Evaluation (Signed)
Anesthesia Evaluation  Patient identified by MRN, date of birth, ID band Patient awake    Reviewed: Allergy & Precautions, H&P , NPO status , Patient's Chart, lab work & pertinent test results, reviewed documented beta blocker date and time   History of Anesthesia Complications Negative for: history of anesthetic complications  Airway Mallampati: II      Dental  (+) Teeth Intact   Pulmonary neg pulmonary ROS,  breath sounds clear to auscultation        Cardiovascular hypertension, Pt. on medications + dysrhythmias Supra Ventricular Tachycardia Rhythm:Regular Rate:Normal     Neuro/Psych PSYCHIATRIC DISORDERS Anxiety    GI/Hepatic   Endo/Other    Renal/GU      Musculoskeletal   Abdominal   Peds  Hematology   Anesthesia Other Findings   Reproductive/Obstetrics                           Anesthesia Physical Anesthesia Plan  ASA: III  Anesthesia Plan: MAC   Post-op Pain Management:    Induction: Intravenous  Airway Management Planned: Nasal Cannula  Additional Equipment:   Intra-op Plan:   Post-operative Plan:   Informed Consent: I have reviewed the patients History and Physical, chart, labs and discussed the procedure including the risks, benefits and alternatives for the proposed anesthesia with the patient or authorized representative who has indicated his/her understanding and acceptance.     Plan Discussed with:   Anesthesia Plan Comments:         Anesthesia Quick Evaluation

## 2012-07-08 NOTE — Transfer of Care (Signed)
Immediate Anesthesia Transfer of Care Note  Patient: [name]  Procedure(s) Performed: Procedure(s) (LRB) with comments: CATARACT EXTRACTION PHACO AND INTRAOCULAR LENS PLACEMENT (IOC) (Left) - CDE: 12.63  Patient Location: PACU and Short Stay  Anesthesia Type: MAC  Level of Consciousness: awake  Airway & Oxygen Therapy: Patient Spontanous Breathing  Post-op Assessment: Report given to PACU RN  Post vital signs: Reviewed  Complications: No apparent anesthesia complications

## 2012-07-08 NOTE — Anesthesia Postprocedure Evaluation (Signed)
  Anesthesia Post-op Note  Patient: [name]  Procedure(s) Performed: Procedure(s) (LRB) with comments: CATARACT EXTRACTION PHACO AND INTRAOCULAR LENS PLACEMENT (IOC) (Left) - CDE: 12.63  Patient Location: PACU and Short Stay  Anesthesia Type: MAC  Level of Consciousness: awake, alert  and oriented  Airway and Oxygen Therapy: Patient Spontanous Breathing  Post-op Pain: none  Post-op Assessment: Post-op Vital signs reviewed, Patient's Cardiovascular Status Stable, Respiratory Function Stable, Patent Airway and No signs of Nausea or vomiting  Post-op Vital Signs: Reviewed and stable  Complications: No apparent anesthesia complications

## 2012-07-08 NOTE — Op Note (Signed)
Patient brought to the operating room and prepped and draped in the usual manner.  Lid speculum inserted in left eye.  Stab incision made at the twelve o'clock position.  Provisc instilled in the anterior chamber.   A 2.4 mm. Stab incision was made temporally.  An anterior capsulotomy was done with a bent 25 gauge needle.  The nucleus was hydrodissected.  The Phaco tip was inserted in the anterior chamber and the nucleus was emulsified.  CDE was 12.63.  The cortical material was then removed with the I and A tip.  Posterior capsule was the polished.  The anterior chamber was deepened with Provisc.  A 22.5 Diopter Rayner 570C IOL was then inserted in the capsular bag.  Provisc was then removed with the I and A tip.  The wound was then hydrated.  Patient sent to the Recovery Room in good condition with follow up in my office.

## 2012-07-08 NOTE — H&P (Signed)
The patient was re examined and there is no change in the patients condition since the original H and P. 

## 2012-07-10 ENCOUNTER — Encounter (HOSPITAL_COMMUNITY): Payer: Self-pay | Admitting: Ophthalmology

## 2012-07-17 ENCOUNTER — Encounter (HOSPITAL_COMMUNITY): Payer: Self-pay | Admitting: Pharmacy Technician

## 2012-07-22 ENCOUNTER — Telehealth: Payer: Self-pay | Admitting: Cardiology

## 2012-07-22 NOTE — Telephone Encounter (Signed)
PT HAS QUESTIONS ABOUT LAB LETTER THAT WAS SENT TO HER

## 2012-07-23 ENCOUNTER — Encounter (HOSPITAL_COMMUNITY): Payer: Self-pay

## 2012-07-23 ENCOUNTER — Inpatient Hospital Stay (HOSPITAL_COMMUNITY): Admission: RE | Admit: 2012-07-23 | Discharge: 2012-07-23 | Payer: Medicare Other | Source: Ambulatory Visit

## 2012-07-23 NOTE — Telephone Encounter (Signed)
Patient wanted to know if she could use Diplomatic Services operational officer on [employer].  Advised her that this was acceptable.

## 2012-07-25 ENCOUNTER — Other Ambulatory Visit: Payer: Self-pay | Admitting: *Deleted

## 2012-07-25 DIAGNOSIS — I10 Essential (primary) hypertension: Secondary | ICD-10-CM

## 2012-07-28 MED ORDER — FLURBIPROFEN SODIUM 0.03 % OP SOLN
OPHTHALMIC | Status: AC
  Filled 2012-07-28: qty 2.5

## 2012-07-28 MED ORDER — PHENYLEPHRINE HCL 2.5 % OP SOLN
OPHTHALMIC | Status: AC
  Filled 2012-07-28: qty 2

## 2012-07-29 ENCOUNTER — Ambulatory Visit (HOSPITAL_COMMUNITY)
Admission: RE | Admit: 2012-07-29 | Discharge: 2012-07-29 | Disposition: A | Discharge status: Home / Self Care | Payer: Medicare Other | Source: Ambulatory Visit | Attending: Ophthalmology | Admitting: Ophthalmology

## 2012-07-29 ENCOUNTER — Encounter (HOSPITAL_COMMUNITY): Payer: Self-pay | Admitting: *Deleted

## 2012-07-29 ENCOUNTER — Encounter (HOSPITAL_COMMUNITY): Payer: Self-pay | Admitting: Anesthesiology

## 2012-07-29 ENCOUNTER — Encounter (HOSPITAL_COMMUNITY)
Admission: RE | Disposition: A | Discharge status: Home / Self Care | Payer: Self-pay | Source: Ambulatory Visit | Attending: Ophthalmology

## 2012-07-29 ENCOUNTER — Ambulatory Visit (HOSPITAL_COMMUNITY): Payer: Medicare Other | Admitting: Anesthesiology

## 2012-07-29 DIAGNOSIS — H2510 Age-related nuclear cataract, unspecified eye: Secondary | ICD-10-CM | POA: Insufficient documentation

## 2012-07-29 DIAGNOSIS — I10 Essential (primary) hypertension: Secondary | ICD-10-CM | POA: Insufficient documentation

## 2012-07-29 HISTORY — PX: CATARACT EXTRACTION W/PHACO: SHX586

## 2012-07-29 SURGERY — PHACOEMULSIFICATION, CATARACT, WITH IOL INSERTION
Anesthesia: Monitor Anesthesia Care | Site: Eye | Laterality: Right | Wound class: Clean

## 2012-07-29 MED ORDER — TETRACAINE HCL 0.5 % OP SOLN
1.0000 [drp] | OPHTHALMIC | Status: AC
  Administered 2012-07-29 (×3): 1 [drp] via OPHTHALMIC

## 2012-07-29 MED ORDER — LACTATED RINGERS IV SOLN
INTRAVENOUS | Status: DC
  Administered 2012-07-29: 09:00:00 via INTRAVENOUS

## 2012-07-29 MED ORDER — EPINEPHRINE HCL 1 MG/ML IJ SOLN
INTRAOCULAR | Status: DC | PRN
  Administered 2012-07-29: 09:00:00

## 2012-07-29 MED ORDER — EPINEPHRINE HCL 1 MG/ML IJ SOLN
INTRAMUSCULAR | Status: AC
  Filled 2012-07-29: qty 1

## 2012-07-29 MED ORDER — PROVISC 10 MG/ML IO SOLN
INTRAOCULAR | Status: DC | PRN
  Administered 2012-07-29: 8.5 mg via INTRAOCULAR

## 2012-07-29 MED ORDER — MIDAZOLAM HCL 2 MG/2ML IJ SOLN
INTRAMUSCULAR | Status: AC
  Filled 2012-07-29: qty 2

## 2012-07-29 MED ORDER — MIDAZOLAM HCL 2 MG/2ML IJ SOLN
1.0000 mg | INTRAMUSCULAR | Status: DC | PRN
  Administered 2012-07-29: 2 mg via INTRAVENOUS

## 2012-07-29 MED ORDER — CYCLOPENTOLATE HCL 1 % OP SOLN: 1.0000 [drp] | OPHTHALMIC | Status: DC

## 2012-07-29 MED ORDER — PHENYLEPHRINE HCL 2.5 % OP SOLN
1.0000 [drp] | OPHTHALMIC | Status: AC
  Administered 2012-07-29 (×3): 1 [drp] via OPHTHALMIC

## 2012-07-29 MED ORDER — FLURBIPROFEN SODIUM 0.03 % OP SOLN
1.0000 [drp] | OPHTHALMIC | Status: AC
  Administered 2012-07-29 (×3): 1 [drp] via OPHTHALMIC

## 2012-07-29 MED ORDER — CYCLOPENTOLATE-PHENYLEPHRINE 0.2-1 % OP SOLN
OPHTHALMIC | Status: AC
  Filled 2012-07-29: qty 2

## 2012-07-29 MED ORDER — BSS IO SOLN
INTRAOCULAR | Status: DC | PRN
  Administered 2012-07-29: 15 mL via INTRAOCULAR

## 2012-07-29 MED ORDER — CYCLOPENTOLATE-PHENYLEPHRINE 0.2-1 % OP SOLN
1.0000 [drp] | OPHTHALMIC | Status: AC
  Administered 2012-07-29 (×3): 1 [drp] via OPHTHALMIC

## 2012-07-29 MED ORDER — TETRACAINE HCL 0.5 % OP SOLN
OPHTHALMIC | Status: AC
  Filled 2012-07-29: qty 2

## 2012-07-29 MED ORDER — LACTATED RINGERS IV SOLN
INTRAVENOUS | Status: DC | PRN
  Administered 2012-07-29: 09:00:00 via INTRAVENOUS

## 2012-07-29 SURGICAL SUPPLY — 24 items
CAPSULAR TENSION RING-AMO (OPHTHALMIC RELATED) IMPLANT
CLOTH BEACON ORANGE TIMEOUT ST (SAFETY) ×1 IMPLANT
EYE SHIELD UNIVERSAL CLEAR (GAUZE/BANDAGES/DRESSINGS) ×1 IMPLANT
GLOVE BIO SURGEON STRL SZ 6.5 (GLOVE) ×1 IMPLANT
GLOVE ECLIPSE 6.5 STRL STRAW (GLOVE) IMPLANT
GLOVE ECLIPSE 7.0 STRL STRAW (GLOVE) IMPLANT
GLOVE EXAM NITRILE LRG STRL (GLOVE) IMPLANT
GLOVE EXAM NITRILE MD LF STRL (GLOVE) ×1 IMPLANT
GLOVE SKINSENSE NS SZ6.5 (GLOVE)
GLOVE SKINSENSE STRL SZ6.5 (GLOVE) IMPLANT
HEALON 5 0.6 ML (INTRAOCULAR LENS) IMPLANT
KIT VITRECTOMY (OPHTHALMIC RELATED) IMPLANT
PAD ARMBOARD 7.5X6 YLW CONV (MISCELLANEOUS) ×1 IMPLANT
PROC W NO LENS (INTRAOCULAR LENS)
PROC W SPEC LENS (INTRAOCULAR LENS)
PROCESS W NO LENS (INTRAOCULAR LENS) IMPLANT
PROCESS W SPEC LENS (INTRAOCULAR LENS) IMPLANT
RING MALYGIN (MISCELLANEOUS) IMPLANT
SIGHTPATH CAT PROC W REG LENS (Ophthalmic Related) ×2 IMPLANT
TAPE SURG TRANSPORE 1 IN (GAUZE/BANDAGES/DRESSINGS) IMPLANT
TAPE SURGICAL TRANSPORE 1 IN (GAUZE/BANDAGES/DRESSINGS) ×1
VISCOELASTIC ADDITIONAL (OPHTHALMIC RELATED) IMPLANT
WATER STERILE IRR 1000ML POUR (IV SOLUTION) ×1 IMPLANT
WATER STERILE IRR 250ML POUR (IV SOLUTION) IMPLANT

## 2012-07-29 NOTE — H&P (Signed)
The patient was re examined and there is no change in the patients condition since the original H and P. 

## 2012-07-29 NOTE — Preoperative (Signed)
Beta Blockers   Reason not to administer Beta Blockers:Not Applicable 

## 2012-07-29 NOTE — Addendum Note (Signed)
Addendum  created 07/29/12 1335 by Franco Nones, CRNA   Modules edited:Charges VN

## 2012-07-29 NOTE — Anesthesia Postprocedure Evaluation (Signed)
  Anesthesia Post-op Note  Patient: [name]  Procedure(s) Performed: Procedure(s) (LRB) with comments: CATARACT EXTRACTION PHACO AND INTRAOCULAR LENS PLACEMENT (IOC) (Right) - CDE:11.02  Patient Location: Short Stay  Anesthesia Type:MAC  Level of Consciousness: awake, alert  and oriented  Airway and Oxygen Therapy: Patient Spontanous Breathing  Post-op Pain: none  Post-op Assessment: Post-op Vital signs reviewed, Patient's Cardiovascular Status Stable, Respiratory Function Stable, Patent Airway, No signs of Nausea or vomiting and Pain level controlled  Post-op Vital Signs: Reviewed and stable  Complications: No apparent anesthesia complications

## 2012-07-29 NOTE — Op Note (Signed)
Patient brought to the operating room and prepped and draped in the usual manner.  Lid speculum inserted in right eye.  Stab incision made at the twelve o'clock position.  Provisc instilled in the anterior chamber.   A 2.4 mm. Stab incision was made temporally.  An anterior capsulotomy was done with a bent 25 gauge needle.  The nucleus was hydrodissected.  The Phaco tip was inserted in the anterior chamber and the nucleus was emulsified.  CDE was 11.02.  The cortical material was then removed with the I and A tip.  Posterior capsule was the polished.  The anterior chamber was deepened with Provisc.  A 23.0 Diopter Rayner 570C IOL was then inserted in the capsular bag.  Provisc was then removed with the I and A tip.  The wound was then hydrated.  Patient sent to the Recovery Room in good condition with follow up in my office.

## 2012-07-29 NOTE — Transfer of Care (Signed)
Immediate Anesthesia Transfer of Care Note  Patient: [name]  Procedure(s) Performed: Procedure(s) (LRB) with comments: CATARACT EXTRACTION PHACO AND INTRAOCULAR LENS PLACEMENT (IOC) (Right) - CDE:11.02  Patient Location: Short Stay  Anesthesia Type:MAC  Level of Consciousness: awake, alert  and oriented  Airway & Oxygen Therapy: Patient Spontanous Breathing  Post-op Assessment: Report given to PACU RN  Post vital signs: Reviewed and stable  Complications: No apparent anesthesia complications

## 2012-07-29 NOTE — Anesthesia Preprocedure Evaluation (Signed)
Anesthesia Evaluation  Patient identified by MRN, date of birth, ID band Patient awake    Reviewed: Allergy & Precautions, H&P , NPO status , Patient's Chart, lab work & pertinent test results, reviewed documented beta blocker date and time   History of Anesthesia Complications Negative for: history of anesthetic complications  Airway Mallampati: II      Dental  (+) Teeth Intact   Pulmonary neg pulmonary ROS,  breath sounds clear to auscultation        Cardiovascular hypertension, Pt. on medications + dysrhythmias Supra Ventricular Tachycardia Rhythm:Regular Rate:Normal     Neuro/Psych PSYCHIATRIC DISORDERS Anxiety    GI/Hepatic   Endo/Other    Renal/GU      Musculoskeletal   Abdominal   Peds  Hematology   Anesthesia Other Findings   Reproductive/Obstetrics                           Anesthesia Physical Anesthesia Plan  ASA: III  Anesthesia Plan: MAC   Post-op Pain Management:    Induction: Intravenous  Airway Management Planned: Nasal Cannula  Additional Equipment:   Intra-op Plan:   Post-operative Plan:   Informed Consent: I have reviewed the patients History and Physical, chart, labs and discussed the procedure including the risks, benefits and alternatives for the proposed anesthesia with the patient or authorized representative who has indicated his/her understanding and acceptance.     Plan Discussed with:   Anesthesia Plan Comments:         Anesthesia Quick Evaluation  

## 2012-08-01 ENCOUNTER — Encounter (HOSPITAL_COMMUNITY): Payer: Self-pay | Admitting: Ophthalmology

## 2012-08-01 LAB — CBC
HCT: 41.5 % (ref 36.0–46.0)
Hemoglobin: 14.2 g/dL (ref 12.0–15.0)
MCH: 32.1 pg (ref 26.0–34.0)
MCV: 93.7 fL (ref 78.0–100.0)
RBC: 4.43 MIL/uL (ref 3.87–5.11)

## 2012-08-01 LAB — COMPREHENSIVE METABOLIC PANEL
Alkaline Phosphatase: 51 U/L (ref 39–117)
BUN: 19 mg/dL (ref 6–23)
Glucose, Bld: 111 mg/dL — ABNORMAL HIGH (ref 70–99)
Sodium: 143 mEq/L (ref 135–145)
Total Bilirubin: 0.3 mg/dL (ref 0.3–1.2)
Total Protein: 6.1 g/dL (ref 6.0–8.3)

## 2012-08-20 ENCOUNTER — Telehealth: Payer: Self-pay | Admitting: Cardiology

## 2012-08-20 NOTE — Telephone Encounter (Signed)
Patient states that she became weak and tired day one of lisinopril.  States she has been reading on the Internet and noticed that this is a symptom of this medication and is afraid this may be causing her symptoms.  States her systolic pressures have consistently been in the 130 range.  Please advise

## 2012-08-20 NOTE — Telephone Encounter (Signed)
Patient states she has been very tired and today very week.  Patient states this is after she has started taking the Lisinopril.  She is wondering if she needs less or to stop taking.  Please call.

## 2012-08-22 NOTE — Telephone Encounter (Signed)
Patient called was told Dr.Rothbart advises to continue lisinopril.States she is feeling ok today, just has times when she is weak.Advised to call back if needed.

## 2012-08-22 NOTE — Telephone Encounter (Signed)
Weakness only occurs when accompanied by hypotension, which is rare.  Patient should persist with this medication to determine if she will tolerate it.

## 2012-11-07 ENCOUNTER — Other Ambulatory Visit: Payer: Self-pay | Admitting: Cardiology

## 2012-12-26 ENCOUNTER — Ambulatory Visit (INDEPENDENT_AMBULATORY_CARE_PROVIDER_SITE_OTHER): Payer: Medicare Other | Admitting: Urology

## 2012-12-26 DIAGNOSIS — N816 Rectocele: Secondary | ICD-10-CM

## 2012-12-26 DIAGNOSIS — R3129 Other microscopic hematuria: Secondary | ICD-10-CM

## 2012-12-26 DIAGNOSIS — N952 Postmenopausal atrophic vaginitis: Secondary | ICD-10-CM

## 2013-07-09 ENCOUNTER — Encounter: Payer: Self-pay | Admitting: Cardiology

## 2013-07-09 ENCOUNTER — Ambulatory Visit (INDEPENDENT_AMBULATORY_CARE_PROVIDER_SITE_OTHER): Payer: Medicare (Managed Care) | Admitting: Cardiology

## 2013-07-09 VITALS — BP 155/88 | HR 86 | Ht 60.0 in | Wt 110.8 lb

## 2013-07-09 DIAGNOSIS — R002 Palpitations: Secondary | ICD-10-CM

## 2013-07-09 DIAGNOSIS — I10 Essential (primary) hypertension: Secondary | ICD-10-CM

## 2013-07-09 DIAGNOSIS — Z23 Encounter for immunization: Secondary | ICD-10-CM

## 2013-07-09 DIAGNOSIS — I517 Cardiomegaly: Secondary | ICD-10-CM

## 2013-07-09 MED ORDER — ATENOLOL 25 MG PO TABS: 25.0000 mg | ORAL_TABLET | Freq: Every day | ORAL | Status: DC

## 2013-07-09 NOTE — Assessment & Plan Note (Signed)
Continue current regimen and observation with home blood pressure checks. Keep follow up with primary care provider.

## 2013-07-09 NOTE — Patient Instructions (Addendum)
Your physician recommends that you schedule a follow-up appointment in: 6 MONTHS  Your physician has recommended you make the following change in your medication:   1) TAKE ATENOLOL 12.5MG  ONCE DAILY (HALF OF THE 25MG  TABLET PRESCRIBED TODAY)  Your physician recommends THAT YOU HAVE AN INFLUENZA INJECTION, INSTRUCTIONS GIVEN WITH DISCHARGE INSTRUCTIONS

## 2013-07-09 NOTE — Assessment & Plan Note (Signed)
Symptomatically well controlled with atenolol. No significant arrhythmia documented by prior cardiac monitoring. Continue observation.

## 2013-07-09 NOTE — Progress Notes (Signed)
    Clinical Summary Ms. Legendre is an 79 y.o.female presenting for an office visit. She is a former patient of Dr. Dietrich Pates, last seen in October 2013. She has been treated with history of hypertension and palpitations. She is here with her husband today who struggles with Parkinson's disease. They have a 24 hour aide and home now to help.  She reports good control of palpitations with atenolol. Home blood pressure checks show systolics ranging generally 120s to 140s.  Lab work from November 2013 revealed potassium 4.8, BUN 19, creatinine 0.7, normal LFTs, hemoglobin 14.2, platelets 242.  Previous cardiac monitoring in 2012 demonstrated sinus rhythm and sinus tachycardia without specific arrhythmias or pauses.   Allergies  Allergen Reactions  . Alprazolam Other (See Comments)    "puts me to sleep for days"  . Caffeine Other (See Comments)    "peps me up"    Current Outpatient Prescriptions  Medication Sig Dispense Refill  . Multiple Vitamin (MULTIVITAMIN WITH MINERALS) TABS Take 1 tablet by mouth daily.      Marland Kitchen atenolol (TENORMIN) 25 MG tablet Take 1 tablet (25 mg total) by mouth daily.  30 tablet  6   No current facility-administered medications for this visit.    Past Medical History  Diagnosis Date  . Essential hypertension, benign   . Anemia     Postoperative; hemoglobin of 11.5 in 04/2011  . LVH (left ventricular hypertrophy)     HOCM physiology  . Anxiety   . Hyperlipidemia   . Arthritis   . Palpitations     Social History Ms. Frary reports that she has never smoked. She has never used smokeless tobacco. Ms. Galeana reports that she does not drink alcohol.  Review of Systems No chest pain or breathlessness. No syncope. Stable appetite. Otherwise negative.  Physical Examination Filed Vitals:   07/09/13 1611  BP: 155/88  Pulse:    Filed Weights   07/09/13 1447  Weight: 110 lb 12 oz (50.236 kg)   Patient appears comfortable at rest. HEENT: Conjunctiva and lids  normal, oropharynx clear. Neck: Supple, no elevated JVP or carotid bruits, no thyromegaly. Lungs: Clear to auscultation, nonlabored breathing at rest. Cardiac: Regular rate and rhythm, no S3 or significant systolic murmur, no pericardial rub. Abdomen: Soft, nontender, bowel sounds present. Extremities: No pitting edema, distal pulses 2+.   Problem List and Plan   Palpitations Symptomatically well controlled with atenolol. No significant arrhythmia documented by prior cardiac monitoring. Continue observation.  Hypertension Continue current regimen and observation with home blood pressure checks. Keep follow up with primary care provider.    Jonelle Sidle, M.D., F.A.C.C.

## 2014-01-05 ENCOUNTER — Encounter: Payer: Self-pay | Admitting: Cardiology

## 2014-01-05 ENCOUNTER — Encounter: Payer: Medicare (Managed Care) | Admitting: Cardiology

## 2014-01-05 NOTE — Progress Notes (Signed)
Patient canceled.  This encounter was created in error - please disregard. 

## 2014-01-26 ENCOUNTER — Encounter: Payer: Medicare (Managed Care) | Admitting: Adult Health
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
## 2014-01-27 NOTE — Progress Notes (Signed)
   Patient ID: [name], female    DOB: 06/18/1928, 80 y.o.   MRN: [id number]  HPI    Review of Systems    Physical Exam     ERROR, RESCHEDULED.

## 2014-02-02 ENCOUNTER — Ambulatory Visit (INDEPENDENT_AMBULATORY_CARE_PROVIDER_SITE_OTHER): Payer: Medicare Other | Admitting: Adult Health

## 2014-02-02 ENCOUNTER — Encounter: Payer: Self-pay | Admitting: Adult Health

## 2014-02-02 VITALS — BP 168/90 | HR 99 | Ht 60.0 in | Wt 110.0 lb

## 2014-02-02 DIAGNOSIS — I10 Essential (primary) hypertension: Secondary | ICD-10-CM

## 2014-02-02 DIAGNOSIS — E785 Hyperlipidemia, unspecified: Secondary | ICD-10-CM

## 2014-02-02 DIAGNOSIS — I517 Cardiomegaly: Secondary | ICD-10-CM

## 2014-02-02 DIAGNOSIS — R002 Palpitations: Secondary | ICD-10-CM

## 2014-02-02 DIAGNOSIS — R011 Cardiac murmur, unspecified: Secondary | ICD-10-CM

## 2014-02-02 MED ORDER — ATENOLOL 25 MG PO TABS: 12.5000 mg | ORAL_TABLET | Freq: Every day | ORAL | Status: DC

## 2014-02-02 NOTE — Progress Notes (Deleted)
Name: [name]    DOB: 08/09/1928  Age: 80 y.o.  MR#: [id number]       PCP:  Kirk RuthsMCGOUGH,WILLIAM M, MD      Insurance: Payor: GENERIC MEDICARE ADVANTAGE / Plan: GENERIC MEDICARE ADVANTAGE / Product Type: *No Product type* /   CC:    Chief Complaint  Patient presents with  . Palpitations  . Hypertension    VS Filed Vitals:   02/02/14 1522  BP: 168/90  Pulse: 99  Height: 5' (1.524 m)  Weight: 110 lb (49.896 kg)    Weights Current Weight  02/02/14 110 lb (49.896 kg)  07/09/13 110 lb 12 oz (50.236 kg)  07/29/12 98 lb (44.453 kg)    Blood Pressure  BP Readings from Last 3 Encounters:  02/02/14 168/90  07/09/13 155/88  07/29/12 128/72     Admit date:  (Not on file) Last encounter with RMR:  Visit date not found   Allergy Alprazolam and Caffeine  Current Outpatient Prescriptions  Medication Sig Dispense Refill  . ibuprofen (ADVIL,MOTRIN) 200 MG tablet Take 200 mg by mouth every 8 (eight) hours as needed.      Marland Kitchen. atenolol (TENORMIN) 25 MG tablet Take 12.5 mg by mouth.      . Multiple Vitamin (MULTIVITAMIN WITH MINERALS) TABS Take 1 tablet by mouth daily.       No current facility-administered medications for this visit.    Discontinued Meds:    Medications Discontinued During This Encounter  Medication Reason  . atenolol (TENORMIN) 25 MG tablet     Patient Active Problem List   Diagnosis Date Noted  . Hyperlipidemia 10/18/2011  . Anxiety 08/17/2011  . Palpitations 07/17/2011  . Hypertension   . LVH (left ventricular hypertrophy)     LABS    Component Value Date/Time   NA 143 07/31/2012 1610   NA 139 07/08/2012 0656   NA 140 05/12/2011 0500   K 4.8 07/31/2012 1610   K 4.1 07/08/2012 0656   K 4.0 05/12/2011 0500   CL 109 07/31/2012 1610   CL 106 05/12/2011 0500   CL 106 05/11/2011 0635   CO2 28 07/31/2012 1610   CO2 29 05/12/2011 0500   CO2 25 05/11/2011 0635   GLUCOSE 111* 07/31/2012 1610   GLUCOSE 102* 07/08/2012 0656   GLUCOSE 110* 05/12/2011 0500   BUN 19  07/31/2012 1610   BUN 13 05/12/2011 0500   BUN 15 05/11/2011 0635   CREATININE 0.75 07/31/2012 1610   CREATININE 0.53 05/12/2011 0500   CREATININE 0.52 05/11/2011 0635   CREATININE 0.54 05/10/2011 1524   CALCIUM 9.7 07/31/2012 1610   CALCIUM 9.0 05/12/2011 0500   CALCIUM 8.9 05/11/2011 0635   GFRNONAA >60 05/12/2011 0500   GFRNONAA >60 05/11/2011 0635   GFRNONAA >60 05/10/2011 1524   GFRAA >60 05/12/2011 0500   GFRAA >60 05/11/2011 0635   GFRAA >60 05/10/2011 1524   CMP     Component Value Date/Time   NA 143 07/31/2012 1610   K 4.8 07/31/2012 1610   CL 109 07/31/2012 1610   CO2 28 07/31/2012 1610   GLUCOSE 111* 07/31/2012 1610   BUN 19 07/31/2012 1610   CREATININE 0.75 07/31/2012 1610   CREATININE 0.53 05/12/2011 0500   CALCIUM 9.7 07/31/2012 1610   PROT 6.1 07/31/2012 1610   ALBUMIN 3.9 07/31/2012 1610   AST 15 07/31/2012 1610   ALT 12 07/31/2012 1610   ALKPHOS 51 07/31/2012 1610   BILITOT 0.3 07/31/2012 1610   GFRNONAA >60  05/12/2011 0500   GFRAA >60 05/12/2011 0500       Component Value Date/Time   WBC 6.5 07/31/2012 1610   WBC 10.5 05/12/2011 0500   WBC 11.1* 05/11/2011 0635   HGB 14.2 07/31/2012 1610   HGB 15.6* 07/08/2012 0656   HGB 15.4* 07/02/2012 1110   HCT 41.5 07/31/2012 1610   HCT 46.0 07/08/2012 0656   HCT 45.4 07/02/2012 1110   MCV 93.7 07/31/2012 1610   MCV 93.6 05/12/2011 0500   MCV 92.8 05/11/2011 0635    Lipid Panel  No results found for this basename: chol, trig, hdl, cholhdl, vldl, ldlcalc    ABG No results found for this basename: phart, pco2, pco2art, po2, po2art, hco3, tco2, acidbasedef, o2sat     No results found for this basename: TSH   BNP (last 3 results) No results found for this basename: PROBNP,  in the last 8760 hours Cardiac Panel (last 3 results) No results found for this basename: CKTOTAL, CKMB, TROPONINI, RELINDX,  in the last 72 hours  Iron/TIBC/Ferritin No results found for this basename: iron, tibc, ferritin     EKG Orders  placed in visit on 07/09/13  . EKG 12-LEAD     Prior Assessment and Plan Problem List as of 02/02/2014     Cardiovascular and Mediastinum   Hypertension   Last Assessment & Plan   07/09/2013 Office Visit Written 07/09/2013  4:14 PM by Jonelle SidleSamuel G McDowell, MD     Continue current regimen and observation with home blood pressure checks. Keep follow up with primary care provider.    LVH (left ventricular hypertrophy)   Last Assessment & Plan   05/31/2011 Office Visit Edited 06/07/2011  2:46 PM by Kathlen Brunswickobert M Rothbart, MD     Patient has evidence for previous inferior MI on her EKG, but this apparently was not verified by her echocardiogram.  She has mild LVH with HOCM physiology, which may have reflected high adrenergic tone at tet time her echocardiogram was performed.  I will review the study and reassess this nice woman in 6 months; however, for now she does not appear to require additional testing or any pharmacologic therapy.      Other   Palpitations   Last Assessment & Plan   07/09/2013 Office Visit Written 07/09/2013  4:13 PM by Jonelle SidleSamuel G McDowell, MD     Symptomatically well controlled with atenolol. No significant arrhythmia documented by prior cardiac monitoring. Continue observation.    Anxiety   Last Assessment & Plan   06/27/2012 Office Visit Written 06/27/2012  1:31 PM by Kathlen Brunswickobert M Rothbart, MD     Anxiety is relatively well controlled with some assistance at home and low dose alprazolam therapy on an as-needed basis.  I encouraged Ms. Jason FilaBray to seek Dr. Dawayne CirriGutterman's help again should symptoms recur.    Hyperlipidemia       Imaging: No results found.

## 2014-02-02 NOTE — Assessment & Plan Note (Signed)
No further complaints of palpitations at this time. She will continue atenolol 12.5 mg daily.

## 2014-02-02 NOTE — Patient Instructions (Signed)
Your physician recommends that you schedule a follow-up appointment in: 6 months with Dr Randa SpikeMcDowell You will receive a reminder letter two months in advance reminding you to call and schedule your appointment. If you don't receive this letter, please contact our office.  Your physician recommends that you return for lab work in CBC, CMET, Direct LDL.  Your physician has requested that you have an echocardiogram. Echocardiography is a painless test that uses sound waves to create images of your heart. It provides your doctor with information about the size and shape of your heart and how well your heart's chambers and valves are working. This procedure takes approximately one hour. There are no restrictions for this procedure.

## 2014-02-02 NOTE — Assessment & Plan Note (Signed)
Labs will be completed today to include a cemented, CBC, and a direct LDL. She has eaten today, and does not have time to return to have labs completed and therefore fasting lipids are not ordered
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
## 2014-02-02 NOTE — Assessment & Plan Note (Addendum)
Blood pressure is labile ranging as high as the 180 systolic 220 systolic. Review of medications shows that she is on atenolol 12.5 mg daily for palpitations and also takes Advil when necessary for arthritic pain. Also hearing a high-pitched systolic murmur. I will repeat echocardiogram as one has not been completed as 2012 demonstrating:  LVH with a midcavity systolic gradient.  I am concerned about this nice woman as she is been neglecting her own health to take care of her husband has become more fatigued. Would not add any medications her regimen at this time until I have an opportunity to review the echocardiogram results. We may at that time make a decision about adding an additional medication for control.

## 2014-02-03 ENCOUNTER — Ambulatory Visit (HOSPITAL_COMMUNITY)
Admission: RE | Admit: 2014-02-03 | Discharge: 2014-02-03 | Disposition: A | Discharge status: Home / Self Care | Payer: Medicare Other | Source: Ambulatory Visit | Attending: Adult Health | Admitting: Adult Health

## 2014-02-03 DIAGNOSIS — R011 Cardiac murmur, unspecified: Secondary | ICD-10-CM | POA: Insufficient documentation

## 2014-02-03 DIAGNOSIS — I10 Essential (primary) hypertension: Secondary | ICD-10-CM | POA: Insufficient documentation

## 2014-02-03 DIAGNOSIS — R002 Palpitations: Secondary | ICD-10-CM | POA: Insufficient documentation

## 2014-02-03 DIAGNOSIS — E785 Hyperlipidemia, unspecified: Secondary | ICD-10-CM | POA: Insufficient documentation

## 2014-02-03 DIAGNOSIS — I379 Nonrheumatic pulmonary valve disorder, unspecified: Secondary | ICD-10-CM

## 2014-02-03 NOTE — Progress Notes (Signed)
  Echocardiogram 2D Echocardiogram has been performed.  [name] 02/03/2014, 11:57 AM

## 2014-02-04 LAB — COMPREHENSIVE METABOLIC PANEL
ALT: 12 U/L (ref 0–35)
AST: 13 U/L (ref 0–37)
Albumin: 3.8 g/dL (ref 3.5–5.2)
Alkaline Phosphatase: 65 U/L (ref 39–117)
BILIRUBIN TOTAL: 0.6 mg/dL (ref 0.2–1.2)
BUN: 21 mg/dL (ref 6–23)
CO2: 26 meq/L (ref 19–32)
Calcium: 9.8 mg/dL (ref 8.4–10.5)
Chloride: 107 mEq/L (ref 96–112)
Creat: 0.63 mg/dL (ref 0.50–1.10)
GLUCOSE: 95 mg/dL (ref 70–99)
Potassium: 4.7 mEq/L (ref 3.5–5.3)
SODIUM: 140 meq/L (ref 135–145)
TOTAL PROTEIN: 6.5 g/dL (ref 6.0–8.3)

## 2014-02-04 LAB — LDL CHOLESTEROL, DIRECT: LDL DIRECT: 135 mg/dL — AB

## 2014-02-04 LAB — CBC
HEMATOCRIT: 43.8 % (ref 36.0–46.0)
HEMOGLOBIN: 15.1 g/dL — AB (ref 12.0–15.0)
MCH: 31.2 pg (ref 26.0–34.0)
MCHC: 34.5 g/dL (ref 30.0–36.0)
MCV: 90.5 fL (ref 78.0–100.0)
Platelets: 272 10*3/uL (ref 150–400)
RBC: 4.84 MIL/uL (ref 3.87–5.11)
RDW: 13.3 % (ref 11.5–15.5)
WBC: 5.5 10*3/uL (ref 4.0–10.5)

## 2014-02-05 ENCOUNTER — Telehealth: Payer: Self-pay | Admitting: *Deleted

## 2014-02-05 MED ORDER — ATORVASTATIN CALCIUM 20 MG PO TABS: 20.0000 mg | ORAL_TABLET | Freq: Every day | ORAL | Status: DC

## 2014-02-05 NOTE — Telephone Encounter (Signed)
Pt made aware and understood 

## 2014-02-05 NOTE — Telephone Encounter (Signed)
Message copied by Thompson Grayer on Fri Feb 05, 2014  2:09 PM ------      Message from: Jodelle Gross      Created: Fri Feb 05, 2014  1:04 PM       Begin lipitor 20 mg daily. Follow up lipid panel in 3 months. CC to PCP. ------

## 2014-02-16 ENCOUNTER — Telehealth: Payer: Self-pay | Admitting: Adult Health

## 2014-02-16 NOTE — Telephone Encounter (Signed)
Pt states she gave wrong info at last visit regarding husbands Lipitor dose.He only takes 40 mg per day,not 80 mg.I will change in his record  She also requested her lab and echo results be mailed to her which I did.

## 2014-02-16 NOTE — Telephone Encounter (Signed)
Patient has questions about her testing and her husband (who is our patient also) medications / tgs

## 2014-03-30 ENCOUNTER — Encounter (HOSPITAL_COMMUNITY)
Admission: EM | Disposition: A | Discharge status: Skilled Nursing Facility | Payer: Medicare Other | Source: Home / Self Care | Attending: Cardiovascular Disease

## 2014-03-30 ENCOUNTER — Inpatient Hospital Stay (HOSPITAL_COMMUNITY)
Admission: EM | Admit: 2014-03-30 | Discharge: 2014-04-07 | DRG: 246 | Disposition: A | Discharge status: Skilled Nursing Facility | Payer: Medicare Other | Attending: Cardiovascular Disease | Admitting: Cardiovascular Disease

## 2014-03-30 ENCOUNTER — Encounter (HOSPITAL_COMMUNITY): Payer: Self-pay | Admitting: Emergency Medicine

## 2014-03-30 ENCOUNTER — Inpatient Hospital Stay (HOSPITAL_COMMUNITY): Payer: Medicare Other

## 2014-03-30 DIAGNOSIS — I5043 Acute on chronic combined systolic (congestive) and diastolic (congestive) heart failure: Secondary | ICD-10-CM | POA: Diagnosis present

## 2014-03-30 DIAGNOSIS — I10 Essential (primary) hypertension: Secondary | ICD-10-CM

## 2014-03-30 DIAGNOSIS — Z961 Presence of intraocular lens: Secondary | ICD-10-CM | POA: Diagnosis not present

## 2014-03-30 DIAGNOSIS — E782 Mixed hyperlipidemia: Secondary | ICD-10-CM | POA: Diagnosis present

## 2014-03-30 DIAGNOSIS — D649 Anemia, unspecified: Secondary | ICD-10-CM | POA: Diagnosis present

## 2014-03-30 DIAGNOSIS — I509 Heart failure, unspecified: Secondary | ICD-10-CM | POA: Diagnosis present

## 2014-03-30 DIAGNOSIS — Z9849 Cataract extraction status, unspecified eye: Secondary | ICD-10-CM | POA: Diagnosis not present

## 2014-03-30 DIAGNOSIS — I2582 Chronic total occlusion of coronary artery: Secondary | ICD-10-CM | POA: Diagnosis present

## 2014-03-30 DIAGNOSIS — J449 Chronic obstructive pulmonary disease, unspecified: Secondary | ICD-10-CM | POA: Diagnosis present

## 2014-03-30 DIAGNOSIS — Z823 Family history of stroke: Secondary | ICD-10-CM | POA: Diagnosis not present

## 2014-03-30 DIAGNOSIS — I2109 ST elevation (STEMI) myocardial infarction involving other coronary artery of anterior wall: Principal | ICD-10-CM

## 2014-03-30 DIAGNOSIS — I498 Other specified cardiac arrhythmias: Secondary | ICD-10-CM | POA: Diagnosis present

## 2014-03-30 DIAGNOSIS — R040 Epistaxis: Secondary | ICD-10-CM

## 2014-03-30 DIAGNOSIS — I2102 ST elevation (STEMI) myocardial infarction involving left anterior descending coronary artery: Secondary | ICD-10-CM | POA: Insufficient documentation

## 2014-03-30 DIAGNOSIS — I214 Non-ST elevation (NSTEMI) myocardial infarction: Secondary | ICD-10-CM

## 2014-03-30 DIAGNOSIS — R002 Palpitations: Secondary | ICD-10-CM | POA: Diagnosis present

## 2014-03-30 DIAGNOSIS — Z23 Encounter for immunization: Secondary | ICD-10-CM | POA: Diagnosis not present

## 2014-03-30 DIAGNOSIS — I5021 Acute systolic (congestive) heart failure: Secondary | ICD-10-CM

## 2014-03-30 DIAGNOSIS — M129 Arthropathy, unspecified: Secondary | ICD-10-CM | POA: Diagnosis present

## 2014-03-30 DIAGNOSIS — F411 Generalized anxiety disorder: Secondary | ICD-10-CM | POA: Diagnosis present

## 2014-03-30 DIAGNOSIS — I2510 Atherosclerotic heart disease of native coronary artery without angina pectoris: Secondary | ICD-10-CM | POA: Diagnosis present

## 2014-03-30 DIAGNOSIS — E785 Hyperlipidemia, unspecified: Secondary | ICD-10-CM | POA: Diagnosis present

## 2014-03-30 DIAGNOSIS — I2589 Other forms of chronic ischemic heart disease: Secondary | ICD-10-CM | POA: Diagnosis present

## 2014-03-30 DIAGNOSIS — I517 Cardiomegaly: Secondary | ICD-10-CM

## 2014-03-30 DIAGNOSIS — J4489 Other specified chronic obstructive pulmonary disease: Secondary | ICD-10-CM | POA: Diagnosis present

## 2014-03-30 DIAGNOSIS — I255 Ischemic cardiomyopathy: Secondary | ICD-10-CM | POA: Diagnosis present

## 2014-03-30 DIAGNOSIS — I5022 Chronic systolic (congestive) heart failure: Secondary | ICD-10-CM

## 2014-03-30 DIAGNOSIS — Z955 Presence of coronary angioplasty implant and graft: Secondary | ICD-10-CM

## 2014-03-30 DIAGNOSIS — F419 Anxiety disorder, unspecified: Secondary | ICD-10-CM

## 2014-03-30 HISTORY — PX: LEFT HEART CATHETERIZATION WITH CORONARY ANGIOGRAM: SHX5451

## 2014-03-30 HISTORY — PX: PERCUTANEOUS CORONARY STENT INTERVENTION (PCI-S): SHX5485

## 2014-03-30 HISTORY — DX: Ischemic cardiomyopathy: I25.5

## 2014-03-30 LAB — CBC WITH DIFFERENTIAL/PLATELET
BASOS ABS: 0 10*3/uL (ref 0.0–0.1)
BASOS PCT: 0 % (ref 0–1)
EOS ABS: 0 10*3/uL (ref 0.0–0.7)
EOS PCT: 0 % (ref 0–5)
HEMATOCRIT: 45.1 % (ref 36.0–46.0)
Hemoglobin: 14.9 g/dL (ref 12.0–15.0)
Lymphocytes Relative: 7 % — ABNORMAL LOW (ref 12–46)
Lymphs Abs: 0.8 10*3/uL (ref 0.7–4.0)
MCH: 31.3 pg (ref 26.0–34.0)
MCHC: 33 g/dL (ref 30.0–36.0)
MCV: 94.7 fL (ref 78.0–100.0)
MONO ABS: 0.7 10*3/uL (ref 0.1–1.0)
Monocytes Relative: 6 % (ref 3–12)
Neutro Abs: 10.1 10*3/uL — ABNORMAL HIGH (ref 1.7–7.7)
Neutrophils Relative %: 87 % — ABNORMAL HIGH (ref 43–77)
PLATELETS: 303 10*3/uL (ref 150–400)
RBC: 4.76 MIL/uL (ref 3.87–5.11)
RDW: 13.8 % (ref 11.5–15.5)
WBC: 11.6 10*3/uL — AB (ref 4.0–10.5)

## 2014-03-30 LAB — BASIC METABOLIC PANEL
Anion gap: 14 (ref 5–15)
BUN: 24 mg/dL — ABNORMAL HIGH (ref 6–23)
CALCIUM: 9.6 mg/dL (ref 8.4–10.5)
CO2: 23 meq/L (ref 19–32)
Chloride: 103 mEq/L (ref 96–112)
Creatinine, Ser: 0.68 mg/dL (ref 0.50–1.10)
GFR calc non Af Amer: 79 mL/min — ABNORMAL LOW (ref >90)
Glucose, Bld: 154 mg/dL — ABNORMAL HIGH (ref 70–99)
Potassium: 5.1 mEq/L (ref 3.7–5.3)
SODIUM: 140 meq/L (ref 137–147)

## 2014-03-30 LAB — PROTIME-INR
INR: 1.5 — ABNORMAL HIGH (ref 0.00–1.49)
PROTHROMBIN TIME: 18.1 s — AB (ref 11.6–15.2)

## 2014-03-30 LAB — MAGNESIUM: MAGNESIUM: 2.3 mg/dL (ref 1.5–2.5)

## 2014-03-30 LAB — POCT ACTIVATED CLOTTING TIME: ACTIVATED CLOTTING TIME: 399 s

## 2014-03-30 LAB — TROPONIN I: Troponin I: 5.21 ng/mL (ref <0.30)

## 2014-03-30 LAB — TSH: TSH: 1.56 u[IU]/mL (ref 0.350–4.500)

## 2014-03-30 SURGERY — LEFT HEART CATHETERIZATION WITH CORONARY ANGIOGRAM
Anesthesia: LOCAL

## 2014-03-30 MED ORDER — ONDANSETRON HCL 4 MG/2ML IJ SOLN
4.0000 mg | Freq: Four times a day (QID) | INTRAMUSCULAR | Status: DC | PRN
  Administered 2014-04-01 – 2014-04-03 (×2): 4 mg via INTRAVENOUS
  Filled 2014-03-30 (×3): qty 2

## 2014-03-30 MED ORDER — METOPROLOL TARTRATE 25 MG PO TABS
25.0000 mg | ORAL_TABLET | Freq: Two times a day (BID) | ORAL | Status: DC
  Administered 2014-03-30: 25 mg via ORAL
  Filled 2014-03-30 (×3): qty 1

## 2014-03-30 MED ORDER — ONDANSETRON HCL 4 MG/2ML IJ SOLN: 4.0000 mg | Freq: Four times a day (QID) | INTRAMUSCULAR | Status: DC | PRN

## 2014-03-30 MED ORDER — HEPARIN BOLUS VIA INFUSION
2000.0000 [IU] | Freq: Once | INTRAVENOUS | Status: AC
  Administered 2014-03-30: 2500 [IU] via INTRAVENOUS

## 2014-03-30 MED ORDER — ATORVASTATIN CALCIUM 40 MG PO TABS: 40.0000 mg | ORAL_TABLET | Freq: Every day | ORAL | Status: DC

## 2014-03-30 MED ORDER — NITROGLYCERIN 0.2 MG/ML ON CALL CATH LAB
INTRAVENOUS | Status: AC
  Filled 2014-03-30: qty 1

## 2014-03-30 MED ORDER — METOPROLOL TARTRATE 1 MG/ML IV SOLN
5.0000 mg | Freq: Once | INTRAVENOUS | Status: DC
  Filled 2014-03-30: qty 5

## 2014-03-30 MED ORDER — LORAZEPAM 2 MG/ML IJ SOLN
0.2500 mg | Freq: Once | INTRAMUSCULAR | Status: DC
  Filled 2014-03-30: qty 1

## 2014-03-30 MED ORDER — FUROSEMIDE 10 MG/ML IJ SOLN
60.0000 mg | Freq: Once | INTRAMUSCULAR | Status: AC
  Administered 2014-03-30: 60 mg via INTRAVENOUS
  Filled 2014-03-30 (×2): qty 6

## 2014-03-30 MED ORDER — TICAGRELOR 90 MG PO TABS
ORAL_TABLET | ORAL | Status: AC
  Filled 2014-03-30: qty 1

## 2014-03-30 MED ORDER — HEPARIN SODIUM (PORCINE) 5000 UNIT/ML IJ SOLN
INTRAMUSCULAR | Status: AC
  Filled 2014-03-30: qty 1

## 2014-03-30 MED ORDER — ADULT MULTIVITAMIN W/MINERALS CH
1.0000 | ORAL_TABLET | Freq: Every day | ORAL | Status: DC
  Administered 2014-03-30 – 2014-04-07 (×9): 1 via ORAL
  Filled 2014-03-30 (×9): qty 1

## 2014-03-30 MED ORDER — TICAGRELOR 90 MG PO TABS
ORAL_TABLET | ORAL | Status: AC
  Administered 2014-03-30: 90 mg via ORAL
  Filled 2014-03-30: qty 1

## 2014-03-30 MED ORDER — ASPIRIN 81 MG PO CHEW
CHEWABLE_TABLET | ORAL | Status: AC
  Filled 2014-03-30: qty 2

## 2014-03-30 MED ORDER — LIDOCAINE HCL (PF) 1 % IJ SOLN
INTRAMUSCULAR | Status: AC
  Filled 2014-03-30: qty 30

## 2014-03-30 MED ORDER — HEPARIN (PORCINE) IN NACL 2-0.9 UNIT/ML-% IJ SOLN
INTRAMUSCULAR | Status: AC
  Filled 2014-03-30: qty 1000

## 2014-03-30 MED ORDER — BIVALIRUDIN 250 MG IV SOLR
INTRAVENOUS | Status: AC
  Filled 2014-03-30: qty 250

## 2014-03-30 MED ORDER — SODIUM CHLORIDE 0.9 % IV SOLN
INTRAVENOUS | Status: DC
  Administered 2014-03-30 – 2014-04-05 (×3): via INTRAVENOUS

## 2014-03-30 MED ORDER — ASPIRIN 81 MG PO CHEW
CHEWABLE_TABLET | ORAL | Status: AC
  Administered 2014-03-30: 324 mg
  Filled 2014-03-30: qty 4

## 2014-03-30 MED ORDER — ATORVASTATIN CALCIUM 80 MG PO TABS
80.0000 mg | ORAL_TABLET | Freq: Every day | ORAL | Status: DC
  Administered 2014-03-31 – 2014-04-06 (×7): 80 mg via ORAL
  Filled 2014-03-30 (×8): qty 1

## 2014-03-30 MED ORDER — ACETAMINOPHEN 325 MG PO TABS: 650.0000 mg | ORAL_TABLET | ORAL | Status: DC | PRN

## 2014-03-30 MED ORDER — ASPIRIN EC 81 MG PO TBEC
81.0000 mg | DELAYED_RELEASE_TABLET | Freq: Every day | ORAL | Status: DC
  Administered 2014-03-31 – 2014-04-07 (×8): 81 mg via ORAL
  Filled 2014-03-30 (×8): qty 1

## 2014-03-30 MED ORDER — HEPARIN (PORCINE) IN NACL 100-0.45 UNIT/ML-% IJ SOLN
550.0000 [IU]/h | INTRAMUSCULAR | Status: DC
  Administered 2014-03-30: 550 [IU]/h via INTRAVENOUS

## 2014-03-30 MED ORDER — ACETAMINOPHEN 325 MG PO TABS
650.0000 mg | ORAL_TABLET | ORAL | Status: DC | PRN
  Administered 2014-04-07 (×2): 325 mg via ORAL
  Filled 2014-03-30 (×2): qty 2

## 2014-03-30 MED ORDER — ASPIRIN EC 81 MG PO TBEC: 81.0000 mg | DELAYED_RELEASE_TABLET | Freq: Every day | ORAL | Status: DC

## 2014-03-30 MED ORDER — SODIUM CHLORIDE 0.9 % IV SOLN: INTRAVENOUS | Status: DC

## 2014-03-30 MED ORDER — NITROGLYCERIN 0.4 MG SL SUBL
0.4000 mg | SUBLINGUAL_TABLET | Freq: Once | SUBLINGUAL | Status: AC
  Administered 2014-03-30: 0.4 mg via SUBLINGUAL

## 2014-03-30 MED ORDER — LORAZEPAM 2 MG/ML IJ SOLN
0.2500 mg | Freq: Once | INTRAMUSCULAR | Status: AC
  Administered 2014-03-30: 0.25 mg via INTRAVENOUS

## 2014-03-30 MED ORDER — TICAGRELOR 90 MG PO TABS
90.0000 mg | ORAL_TABLET | Freq: Two times a day (BID) | ORAL | Status: DC
  Administered 2014-03-30 – 2014-04-07 (×16): 90 mg via ORAL
  Filled 2014-03-30 (×18): qty 1

## 2014-03-30 MED ORDER — NITROGLYCERIN 0.4 MG SL SUBL: 0.4000 mg | SUBLINGUAL_TABLET | SUBLINGUAL | Status: DC | PRN

## 2014-03-30 MED ORDER — HEPARIN BOLUS VIA INFUSION: 4000.0000 [IU] | Freq: Once | INTRAVENOUS | Status: DC

## 2014-03-30 MED ORDER — SODIUM CHLORIDE 0.9 % IV SOLN
1.7500 mg/kg/h | INTRAVENOUS | Status: AC
  Administered 2014-03-30: 1.75 mg/kg/h via INTRAVENOUS
  Filled 2014-03-30: qty 250

## 2014-03-30 MED ORDER — HEPARIN (PORCINE) IN NACL 100-0.45 UNIT/ML-% IJ SOLN
INTRAMUSCULAR | Status: AC
  Filled 2014-03-30: qty 250

## 2014-03-30 NOTE — ED Notes (Signed)
CRITICAL VALUE ALERT  Critical value received:  Trop 5.21  Date of notification:  03/30/2014  Time of notification:  1527  Critical value read back:Yes.    Nurse who received alert:  Crisol Muecke      Responding MD: 1527  Time MD responded:  1527

## 2014-03-30 NOTE — Progress Notes (Signed)
[facility] Physician-Brief Progress Note Patient Name: [name] DOB: 12/09/1927 MRN: [id number]  Date of Service  03/30/2014   HPI/Events of Note   STEMI s/p PCI / DES Acute dyspnea / increased WOB Pulmonary edema on CXR HR 106   eICU Interventions   BiPAP Lasix 40 x 1 Metoprolol 5 x 1    Intervention Category Major Interventions: Respiratory failure - evaluation and management  Saleemah Mollenhauer 03/30/2014, 8:45 PM

## 2014-03-30 NOTE — ED Notes (Signed)
Paged on call stemi MD through [facility] and paged to [phone]

## 2014-03-30 NOTE — ED Provider Notes (Signed)
TIME SEEN: 2:15 PM  CHIEF COMPLAINT: Panic attack  HPI: Patient is a 80 y.o. F with history of hypertension on atenolol, LVH, hyperlipidemia, anxiety who presents the emergency department with complaints of a panic attack today. She reports that her husband has Parkinson's and she has been under a lot of stress recently and not sleeping well. She states today at approximately noon she had a panic attack and had palpitations, shortness of breath and felt weak all over. She states this lasted for approximately one hour. She called her friend who is a Engineer, civil (consulting)nurse and recommended she come to the emergency department. She states she is now completely asymptomatic. She states her symptoms feel exactly like her prior panic attacks. She denies any history of chest pain or chest discomfort. She states her last panic attack was several weeks ago. Denies any SI or HI. No hallucinations.  ROS: See HPI Constitutional: no fever  Eyes: no drainage  ENT: no runny nose   Cardiovascular:  no chest pain  Resp: no SOB  GI: no vomiting GU: no dysuria Integumentary: no rash  Allergy: no hives  Musculoskeletal: no leg swelling  Neurological: no slurred speech ROS otherwise negative  PAST MEDICAL HISTORY/PAST SURGICAL HISTORY:  Past Medical History  Diagnosis Date  . Essential hypertension, benign   . Anemia     Postoperative; hemoglobin of 11.5 in 04/2011  . LVH (left ventricular hypertrophy)     HOCM physiology  . Anxiety   . Hyperlipidemia   . Arthritis   . Palpitations   . Anxiety     MEDICATIONS:  Prior to Admission medications   Medication Sig Start Date End Date Taking? Authorizing Provider  atenolol (TENORMIN) 25 MG tablet Take 0.5 tablets (12.5 mg total) by mouth daily. 02/02/14   Jodelle GrossKathryn M Lawrence, NP  atorvastatin (LIPITOR) 20 MG tablet Take 1 tablet (20 mg total) by mouth daily. 02/05/14   Jodelle GrossKathryn M Lawrence, NP  ibuprofen (ADVIL,MOTRIN) 200 MG tablet Take 200 mg by mouth every 8 (eight) hours as  needed.    Historical Provider, MD  Multiple Vitamin (MULTIVITAMIN WITH MINERALS) TABS Take 1 tablet by mouth daily.    Historical Provider, MD    ALLERGIES:  Allergies  Allergen Reactions  . Alprazolam Other (See Comments)    "puts me to sleep for days"  . Caffeine Other (See Comments)    "peps me up"    SOCIAL HISTORY:  History  Substance Use Topics  . Smoking status: Never Smoker   . Smokeless tobacco: Never Used  . Alcohol Use: No    FAMILY HISTORY: Family History  Problem Relation Age of Onset  . Stroke Father [id number]  . Stroke Mother [id number]    EXAM: BP 148/90  Pulse 115  Temp(Src) 97.2 F (36.2 C) (Oral)  Resp 22  Ht 5' (1.524 m)  Wt 103 lb (46.72 kg)  BMI 20.12 kg/m2  SpO2 96% CONSTITUTIONAL: Alert and oriented and responds appropriately to questions. Well-appearing; well-nourished, pleasant, smiling HEAD: Normocephalic EYES: Conjunctivae clear, PERRL ENT: normal nose; no rhinorrhea; moist mucous membranes; pharynx without lesions noted NECK: Supple, no meningismus, no LAD  CARD: Regular and tachycardic; S1 and S2 appreciated; no murmurs, no clicks, no rubs, no gallops RESP: Normal chest excursion without splinting or tachypnea; breath sounds clear and equal bilaterally; no wheezes, no rhonchi, no rales,  ABD/GI: Normal bowel sounds; non-distended; soft, non-tender, no rebound, no guarding BACK:  The back appears normal and is non-tender to palpation, there is no  CVA tenderness EXT: Normal ROM in all joints; non-tender to palpation; no edema; normal capillary refill; no cyanosis    SKIN: Normal color for age and race; warm NEURO: Moves all extremities equally PSYCH: The patient's mood and manner are appropriate. Grooming and personal hygiene are appropriate.  MEDICAL DECISION MAKING: Patient here with symptoms that she describes as similar to her prior panic attack. Given her age and comorbidities however will check cardiac labs, electrolytes, EKG. We'll continue to  closely monitor. Her tachycardia is slowly improving without intervention.  ED PROGRESS: 3:30 PM  Patient's troponin has come back elevated at 5.2. Her other labs are unremarkable other than a mild leukocytosis which is likely reactive. When I went to reevaluate the patient, she is still hemodynamically stable and is adamant that she is completely asymptomatic. No chest pain or chest discomfort, shortness of breath, nausea vomiting, diaphoresis, dizziness or weakness. She denies feeling any palpitations. No abdominal or back pain. Discussed with Dr. Herbie Baltimore with cardiology who has reviewed her EKGs and agrees given she is asymptomatic but this is not a STEMI other her EKG is changed compared to prior with ST elevation in her anterior leads and Q waves. We'll continue to closely monitor patient. We'll start heparin bolus and drip. We'll transfer to [place] to take her to the cardiac catheterization lab. Family updated with plan. We'll give aspirin.   4:00 PM Patient becoming increasingly anxious, we'll give very small amount of IV Ativan and she is very upset given her elevated troponin. She still denies any chest pain, shortness of breath, abdominal pain or back pain.   4:45 PM  Pt is now complaining of tightness in her lower chest and epigastric region. Her abdominal exam is benign. She is equal pulses in all of her extremities but is now having episodes where she will become bradycardic in the 30s. Her HR will quickly return back into the 100s. She has not been given atropine. Pacemaker pads are on the patient currently. She is hooked up to the monitor. Blood pressure is stable. We'll give nitroglycerin for her chest pain. Repeat EKG is similar to her prior other EKGs but now has some reciprocal changes in inferior and lateral leads. Discussed with Dr. Herbie Baltimore again and we both now feel that she is likely having a STEMI given her EKG changes and that she is symptomatic. [facility] is at bedside to  transfer her emergently to [facility].   EKG Interpretation  Date/Time:  Tuesday March 30 2014 14:57:42 EDT Ventricular Rate:  103 PR Interval:  146 QRS Duration: 78 QT Interval:  358 QTC Calculation: 468 R Axis:   72 Text Interpretation:  Sinus tachycardia Possible Left atrial enlargement Low voltage QRS Anterolateral infarct , age undetermined Abnormal ECG Confirmed by Laira Penninger,  DO, Wylan Gentzler [phone]) on 03/30/2014 3:04:03 PM       EKG Interpretation  Date/Time:  Tuesday March 30 2014 15:48:37 EDT Ventricular Rate:  111 PR Interval:  159 QRS Duration: 80 QT Interval:  310 QTC Calculation: 421 R Axis:   -60 Text Interpretation:  Sinus tachycardia Multiple ventricular premature complexes Probable left atrial enlargement LAD, consider left anterior fascicular block Anterior infarct, old Artifact in lead(s) II III aVR aVL aVF Confirmed by Theoplis Garciagarcia,  DO, Riverlyn Kizziah ([id number]) on 03/30/2014 5:04:16 PM        EKG Interpretation  Date/Time:  Tuesday March 30 2014 16:22:36 EDT Ventricular Rate:  110 PR Interval:  154 QRS Duration: 83 QT Interval:  310 QTC  Calculation: 419 R Axis:   30 Text Interpretation:  Sinus tachycardia Biatrial enlargement Anterior infarct, old Borderline repolarization abnormality ST elevation, consider inferior injury Artifact in lead(s) II III aVR aVL aVF V6 Anterior STEMI Confirmed by Kainalu Heggs,  DO, Brittanyann Wittner ([id number]) on 03/30/2014 5:05:07 PM        CRITICAL CARE Performed by: Raelyn Number   Total critical care time: 60 minutes  Critical care time was exclusive of separately billable procedures and treating other patients.  Critical care was necessary to treat or prevent imminent or life-threatening deterioration.  Critical care was time spent personally by me on the following activities: development of treatment plan with patient and/or surrogate as well as nursing, discussions with consultants, evaluation of patient's response to treatment, examination of patient,  obtaining history from patient or surrogate, ordering and performing treatments and interventions, ordering and review of laboratory studies, ordering and review of radiographic studies, pulse oximetry and re-evaluation of patient's condition.    Layla Maw Anysha Frappier, DO 03/30/14 1711

## 2014-03-30 NOTE — Progress Notes (Signed)
Removed patient from Bipap and placed on 4lpm nasal cannula with Sp02 at this time of 95% and respiratory rate of 17bpm. Will continue to monitor patient.

## 2014-03-30 NOTE — Progress Notes (Signed)
ANTICOAGULATION CONSULT NOTE - Initial Consult  Pharmacy Consult for Heparin Indication: chest pain/ACS  Allergies  Allergen Reactions  . Alprazolam Other (See Comments)    "puts me to sleep for days"  . Caffeine Other (See Comments)    "peps me up"   Patient Measurements: Height: 5' (152.4 cm) Weight: 103 lb (46.72 kg) IBW/kg (Calculated) : 45.5  Vital Signs: Temp: 97.2 F (36.2 C) (07/14 1254) Temp src: Oral (07/14 1254) BP: 150/92 mmHg (07/14 1609) Pulse Rate: 109 (07/14 1609)  Labs:  Recent Labs  03/30/14 1445  HGB 14.9  HCT 45.1  PLT 303  CREATININE 0.68  TROPONINI 5.21*   Estimated Creatinine Clearance: 38.3 ml/min (by C-G formula based on Cr of 0.68).  Medical History: Past Medical History  Diagnosis Date  . Essential hypertension, benign   . Anemia     Postoperative; hemoglobin of 11.5 in 04/2011  . LVH (left ventricular hypertrophy)     HOCM physiology  . Anxiety   . Hyperlipidemia   . Arthritis   . Palpitations   . Anxiety    Medications:   (Not in a hospital admission)  Assessment: 80yo elderly, small female admitted via ED with irregular BP.  Asked to initiate IV Heparin for ACS.    Goal of Therapy:  Heparin level 0.3-0.7 units/ml Monitor platelets by anticoagulation protocol: Yes   Plan:  Heparin 2000 units IV bolus now x 1 Heparin infusion at 12 units/Kg/Hr (ADJ BW) Heparin level in 6-8 hours then daily CBC daily while on Heparin.    Valrie HartHall, Esmae Donathan A 03/30/2014,4:19 PM
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
## 2014-03-30 NOTE — ED Notes (Addendum)
Pt c/o of pressure/tightness in abdominal/epigastric area. Patient SOB. Color ashen grey. Ward notified;into assess patient. [place] 4L.  ntg sl x1 per Ward's order.

## 2014-03-30 NOTE — ED Notes (Signed)
MD at bedside. 

## 2014-03-30 NOTE — CV Procedure (Addendum)
[name] is a 80 y.o. female    [id number]  [id number] LOCATION:  FACILITY: [facility]  PHYSICIAN: Lennette Biharihomas A. Kelly, MD, [facility] 07/02/1928   DATE OF PROCEDURE:  03/30/2014    EMERGENT CARDIAC CATHETERIZATION/PERCUTANEOUS CORONARY INTERVENTION     HISTORY:    Terri ErmMae Y Mierzwa is a 80 y.o. female who has a history of hypertension, and developed episodes of increasing shortness of breath yesterday.  She [facility].  ECG showed QS complex anteriorly.  Troponin was positive.  She developed recurrent chest pain.  She was transferred for ST segment elevation anterior wall myocardial infarction is taken acutely to the cardiac catheterization laboratory.   PROCEDURE: Left heart catheterization: Coronary angiography, left ventriculography; emergent percutaneous coronary intervention with PTCA/stenting of a totally occluded proximal LAD  The patient was brought to the cath lab by EMS in transfer [facility]. She was premedicated with Ativan prior to transfer and upon arrival was sedated.  The right femoral artery was punctured anteriorly and a 5 [place] external sheath was inserted without difficulty. Diagnostic catheterization was done utilizing 5 [place] JL 3.5 and JR 4 diagnostic catheters.  The sheath was upgraded to a 6 [place] sheath.  A 6 French XB LAD 3.5 guide was used for the procedure.  Angiomax bolus plus infusion was administered.  An Asahi medium wire was able to cross the total proximal occlusion.  Initial PTCA dilatation was done with a emergent 2.0x12 mm balloon at 6 and 10 atmospheres.  The demonstration of stenosis beyond the initial occlusion a resolute integrity 3.0x26 mm DES stent was inserted to cover the entire region.  This was dilated at 12 and 14 atmospheres.  Post stent dilatation was done with an [place] Euphora 3.25x20 mm balloon 3 inflations up to 14 atmospheres.  Angiography confirmed an excellent angiographic result.  Left ventriculography was  done with 25 cc of contrast at 12 cc per second.  The arterial sheath was sutured in place.  Angiomax will be be continued for 4 hours post procedure.  The patient left the catheterization laboratory, chest pain-free.   HEMODYNAMICS:   Central Aorta: Initial AO pressure was 104/70  On LV pullback:   Left Ventricle: 83/20 and AO pressure 83/50  ANGIOGRAPHY:  Left main: Normal vessel, which bifurcated into the LAD and left circumflex vessel.  LAD: Totally occluded just beyond the ostium with TIMI 0 flow  Left circumflex: Angiographically normal vessel, which gave rise to one major marginal branch.   Right coronary artery: Large, dominant RCA, which supplied a large PDA and PLA vessel   PCI:  Following initial dilatation of the LAD beyond the ostium.  There was diffuse narrowing of 50% in the region of the first diagonal vessel.  The LAD was large caliber and extended and wrapped around the LV apex and gave rise to additional second diagonal vessel, which had 40-50% mid narrowing, and there was 40%, mild narrowing in the mid LAD beyond the second diagonal vessel.  Following PCI with PTCA/stenting with insertion of a 3.0x26 mm Resolute Integrity DES stent artery.  The ostial and proximal lesions, entire region was reduced to 0%.  There was brisk TIMI-3 flow.  There is no evidence for dissection.   Left ventriculography revealed severe acute LV dysfunction with an ejection fraction of 15-20%.  There was severe hypo-akinesis involving the anterolateral wall, akinesis of the apex, and severe hypo-to kinesis involving the inferior wall, extending to the mid segment.  The  posterior basal segment contracted vigorously.   IMPRESSION:  Acute ST segment elevation anterior wall myocardial infarction secondary to total proximal LAD occlusion with initial TIMI 0 flow.  Normal left circumflex and dominant right coronary arteries.  Successful emergent PCI to the total proximal LAD with ultimate  insertion of a 3.0x26 mm Resolute Integrity DES stent with the stenosis being reduced to 0% and showed TIMI 3 flow.  There is also evidence for 40%-50% diagonal 2 stenosis and 40% LAD stenosis.  RECOMMENDATION:  On presentation, the patient's ECG suggests LAD occlusion, which may have begun since yesterday in light of her symptoms of increasing shortness of breath.  She does have extensive wall motion abnormality , which, hopefully will have some recovery of function.  Medical therapy will be recommended for this large myocardial infarction including carvedilol, ACE or ARB therapy, and dual antiplatelet therapy.  She will need to be monitored to make certain she does not develop LV thrombus due to her akinetic apical segment  Lennette Bihari, MD, [facility] 03/30/2014 6:45 PM

## 2014-03-30 NOTE — ED Notes (Signed)
Ward at bedside. Spoke with patient and daughter regarding plan of care, which [facility]. Pt anxious before however escalated after hearing news.

## 2014-03-30 NOTE — ED Notes (Signed)
Pt presents to er with friend for evaluation of irregular blood pressure for awhile, stress at home due to husband's medical condition,  Pt reluctant to speak about reasons for coming to er, when asked several times, pt states that she divided her blood pressure medication dose to a quarter of a tablet. Prescription is written for atenolol 25 mg take one half tablet by mouth once daily. Pt anxious in triage, states that her reason is more psychological than medical.

## 2014-03-31 ENCOUNTER — Inpatient Hospital Stay (HOSPITAL_COMMUNITY): Payer: Medicare Other

## 2014-03-31 DIAGNOSIS — I214 Non-ST elevation (NSTEMI) myocardial infarction: Secondary | ICD-10-CM

## 2014-03-31 DIAGNOSIS — I517 Cardiomegaly: Secondary | ICD-10-CM

## 2014-03-31 LAB — HEMOGLOBIN A1C
Hgb A1c MFr Bld: 5.8 % — ABNORMAL HIGH (ref <5.7)
Mean Plasma Glucose: 120 mg/dL — ABNORMAL HIGH (ref <117)

## 2014-03-31 LAB — CBC
HCT: 42.5 % (ref 36.0–46.0)
HEMOGLOBIN: 14.2 g/dL (ref 12.0–15.0)
MCH: 31.6 pg (ref 26.0–34.0)
MCHC: 33.4 g/dL (ref 30.0–36.0)
MCV: 94.4 fL (ref 78.0–100.0)
Platelets: 284 10*3/uL (ref 150–400)
RBC: 4.5 MIL/uL (ref 3.87–5.11)
RDW: 14 % (ref 11.5–15.5)
WBC: 13.5 10*3/uL — ABNORMAL HIGH (ref 4.0–10.5)

## 2014-03-31 LAB — BASIC METABOLIC PANEL
Anion gap: 16 — ABNORMAL HIGH (ref 5–15)
BUN: 31 mg/dL — ABNORMAL HIGH (ref 6–23)
CALCIUM: 9.1 mg/dL (ref 8.4–10.5)
CHLORIDE: 102 meq/L (ref 96–112)
CO2: 22 mEq/L (ref 19–32)
Creatinine, Ser: 0.59 mg/dL (ref 0.50–1.10)
GFR calc Af Amer: >90 mL/min (ref >90)
GFR calc non Af Amer: 82 mL/min — ABNORMAL LOW (ref >90)
GLUCOSE: 113 mg/dL — AB (ref 70–99)
Potassium: 3.7 mEq/L (ref 3.7–5.3)
SODIUM: 140 meq/L (ref 137–147)

## 2014-03-31 LAB — COMPREHENSIVE METABOLIC PANEL
ALBUMIN: 3.3 g/dL — AB (ref 3.5–5.2)
ALK PHOS: 63 U/L (ref 39–117)
ALT: 27 U/L (ref 0–35)
ANION GAP: 20 — AB (ref 5–15)
AST: 70 U/L — ABNORMAL HIGH (ref 0–37)
BUN: 30 mg/dL — AB (ref 6–23)
CALCIUM: 9.3 mg/dL (ref 8.4–10.5)
CO2: 20 mEq/L (ref 19–32)
Chloride: 98 mEq/L (ref 96–112)
Creatinine, Ser: 0.57 mg/dL (ref 0.50–1.10)
GFR calc non Af Amer: 83 mL/min — ABNORMAL LOW (ref >90)
Glucose, Bld: 125 mg/dL — ABNORMAL HIGH (ref 70–99)
POTASSIUM: 4 meq/L (ref 3.7–5.3)
SODIUM: 138 meq/L (ref 137–147)
TOTAL PROTEIN: 6.7 g/dL (ref 6.0–8.3)
Total Bilirubin: 0.7 mg/dL (ref 0.3–1.2)

## 2014-03-31 LAB — LIPID PANEL
CHOL/HDL RATIO: 2.8 ratio
CHOLESTEROL: 193 mg/dL (ref 0–200)
HDL: 69 mg/dL (ref >39)
LDL Cholesterol: 104 mg/dL — ABNORMAL HIGH (ref 0–99)
Triglycerides: 99 mg/dL (ref <150)
VLDL: 20 mg/dL (ref 0–40)

## 2014-03-31 LAB — TROPONIN I
TROPONIN I: 7.47 ng/mL — AB (ref <0.30)
Troponin I: 16.02 ng/mL (ref <0.30)

## 2014-03-31 LAB — PRO B NATRIURETIC PEPTIDE: PRO B NATRI PEPTIDE: 10916 pg/mL — AB (ref 0–450)

## 2014-03-31 LAB — MRSA PCR SCREENING: MRSA by PCR: NEGATIVE

## 2014-03-31 MED ORDER — BIOTENE DRY MOUTH MT LIQD
15.0000 mL | Freq: Two times a day (BID) | OROMUCOSAL | Status: DC
  Administered 2014-03-31 – 2014-04-02 (×4): 15 mL via OROMUCOSAL

## 2014-03-31 MED ORDER — LOSARTAN POTASSIUM 25 MG PO TABS
25.0000 mg | ORAL_TABLET | Freq: Every day | ORAL | Status: DC
  Administered 2014-03-31 – 2014-04-03 (×3): 25 mg via ORAL
  Filled 2014-03-31 (×5): qty 1

## 2014-03-31 MED ORDER — CARVEDILOL 6.25 MG PO TABS
6.2500 mg | ORAL_TABLET | Freq: Two times a day (BID) | ORAL | Status: DC
  Administered 2014-03-31 – 2014-04-04 (×7): 6.25 mg via ORAL
  Filled 2014-03-31 (×11): qty 1

## 2014-03-31 MED ORDER — FUROSEMIDE 10 MG/ML IJ SOLN
20.0000 mg | Freq: Every day | INTRAMUSCULAR | Status: DC
  Administered 2014-03-31 – 2014-04-03 (×4): 20 mg via INTRAVENOUS
  Filled 2014-03-31 (×5): qty 2

## 2014-03-31 MED FILL — Sodium Chloride IV Soln 0.9%: INTRAVENOUS | Qty: 50 | Status: AC

## 2014-03-31 NOTE — Progress Notes (Signed)
  Echocardiogram 2D Echocardiogram has been performed.  Arvil ChacoFoster, Damontay Alred 03/31/2014, 12:33 PM

## 2014-03-31 NOTE — Progress Notes (Signed)
Pt given 30day free brilinta card. Will have 64.00 per month copay w no prior auth req.

## 2014-03-31 NOTE — Care Management Note (Addendum)
    Page 1 of 2   04/06/2014     3:56:09 PM CARE MANAGEMENT NOTE 04/06/2014  Patient:  [name],[name]   Account Number:  [id number]  Date Initiated:  03/31/2014  Documentation initiated by:  Junius CreamerWELL,DEBBIE  Subjective/Objective Assessment:   adm w mi     Action/Plan:   lives w fam, pcp dr Karleen Hampshirewilliam mcgough   Anticipated DC Date:  04/05/2014   Anticipated DC Plan:  HOME W HOME HEALTH SERVICES      DC Planning Services  CM consult  Medication Assistance      [facility]   Choice offered to / List presented to:  C-1 Patient           Status of service:  Completed, signed off Medicare Important Message given?  YES (If response is "NO", the following Medicare IM given date fields will be blank) Date Medicare IM given:  04/02/2014 Medicare IM given by:  [facility],Vaneza Pickart Date Additional Medicare IM given:  04/05/2014 Additional Medicare IM given by:  Isidoro DonningALESIA SHAVIS  Discharge Disposition:  SKILLED NURSING FACILITY  Per UR Regulation:  Reviewed for med. necessity/level of care/duration of stay  If discussed at Long Length of Stay Meetings, dates discussed:    Comments:  04-06-14 Barkley Bruns4pm Elman Dettman, RNBSN [phone] Not feeling well today - has had a bloody nose through night.  Daughter in room.  Plan for rehab - [facility] - that will take a life vest.  SW already working with them.  04/05/2014 1500 NCM faxed H&P, facesheet and progress notes to [facility]. Lifevest order form placed on shadow chart for MD to sign. Will fax when order signed. Will need order in EPIC for Lifevest. Pt plan is SNF. Pt states she prefers dc to home with HH. Requested [facility] if dc home. 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 Privately paid.  [place].  Physician talked to about considration of facility. [facility].   States home works well.  Large late presentation MI. Still on heparin. - May need lifevest.   CM will continue to follow  7/15 1131a debbie dowell rn.bsn gave pt 30day free brilinta card. pt will have 64.00 per month copay. no prior auth req. ContactJules Husbands:  [name],Terri W Spouse [phone]                 [name],Terri Daughter [phone]

## 2014-03-31 NOTE — Progress Notes (Signed)
Patient ID: [name], female   DOB: 1928-02-18, 80 y.o.   MRN: [id number]    Subjective:  Denies SSCP, palpitations or Dyspnea Needed bipap last night for CHF   Objective:  Filed Vitals:   03/31/14 0500 03/31/14 0600 03/31/14 0700 03/31/14 0759  BP: 124/61 115/70 102/71   Pulse: 89 86 89   Temp:    98.3 F (36.8 C)  TempSrc:    Oral  Resp: 24 16 19    Height:      Weight:      SpO2: 95% 96% 95%     Intake/Output from previous day:  Intake/Output Summary (Last 24 hours) at 03/31/14 0810 Last data filed at 03/31/14 0759  Gross per 24 hour  Intake 150.75 ml  Output   1000 ml  Net -849.25 ml    Physical Exam: Affect appropriate Frail elderly female  HEENT: normal Neck supple with no adenopathy JVP normal no bruits no thyromegaly Lungs basilar crackles  no wheezing and good diaphragmatic motion Heart:  S1/S2 no murmur, no rub, gallop or click PMI normal Abdomen: benighn, BS positve, no tenderness, no AAA  RFA sight A no hematoma  no bruit.  No HSM or HJR Distal pulses intact with no bruits No edema Neuro non-focal Skin warm and dry No muscular weakness   Lab Results: Basic Metabolic Panel:  Recent Labs  40/98/11 1445 03/31/14 0045  NA 140 138  K 5.1 4.0  CL 103 98  CO2 23 20  GLUCOSE 154* 125*  BUN 24* 30*  CREATININE 0.68 0.57  CALCIUM 9.6 9.3  MG 2.3  --    Liver Function Tests:  Recent Labs  03/31/14 0045  AST 70*  ALT 27  ALKPHOS 63  BILITOT 0.7  PROT 6.7  ALBUMIN 3.3*   CBC:  Recent Labs  03/30/14 1445 03/31/14 0045  WBC 11.6* 13.5*  NEUTROABS 10.1*  --   HGB 14.9 14.2  HCT 45.1 42.5  MCV 94.7 94.4  PLT 303 284   Cardiac Enzymes:  Recent Labs  03/30/14 1445 03/30/14 2113 03/31/14 0045  TROPONINI 5.21* >20.00* 16.02*   Hemoglobin A1C:  Recent Labs  03/30/14 2000  HGBA1C 5.8*   Fasting Lipid Panel:  Recent Labs  03/31/14 0045  CHOL 193  HDL 69  LDLCALC 104*  TRIG 99  CHOLHDL 2.8   Thyroid Function  Tests:  Recent Labs  03/30/14 2000  TSH 1.560    Imaging: Portable Chest X-ray 1 View  03/31/2014   CLINICAL DATA:  Shortness of breath, CHF  EXAM: PORTABLE CHEST - 1 VIEW  COMPARISON:  Portable chest x-ray of March 30, 2014  FINDINGS: The lungs are well-expanded. The interstitial markings are less conspicuous today. The retrocardiac region on the left remains dense and partial obscuration of the left hemidiaphragm persists. The cardiac silhouette is mildly enlarged. The pulmonary vascularity remains engorged but is more distinct. The bones are osteopenic.  IMPRESSION: Improving CHF superimposed upon COPD. Left lower lobe atelectasis or infiltrate persists but is slightly less conspicuous.   Electronically Signed   By: David  [place]   On: 03/31/2014 07:36   Dg Chest Port 1 View  03/30/2014   CLINICAL DATA:  Myocardial infarction  EXAM: PORTABLE CHEST - 1 VIEW  COMPARISON:  May 10, 2011  FINDINGS: There is underlying emphysematous change. There is mild diffuse interstitial edema. There is patchy airspace consolidation in the left base. Heart is enlarged with pulmonary venous hypertension. No adenopathy. There is atherosclerotic change  in the aorta. Bones are osteoporotic.  IMPRESSION: Evidence of congestive heart failure superimposed on emphysematous change. Consolidation behind the left heart is probably indicative of underlying pneumonia, although this finding could be due to alveolar edema. There is extensive calcification in the aortic arch region.   Electronically Signed   By: Bretta BangWilliam  Woodruff M.D.   On: 03/30/2014 19:44    Cardiac Studies:  ECG:    Telemetry:  SR ST no VT  Echo:   02/03/14  EF 65%    Medications:   . antiseptic oral rinse  15 mL Mouth Rinse BID  . aspirin EC  81 mg Oral Daily  . atorvastatin  80 mg Oral q1800  . metoprolol  5 mg Intravenous Once  . metoprolol tartrate  25 mg Oral BID  . multivitamin with minerals  1 tablet Oral Daily  . ticagrelor  90 mg Oral BID      . 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

## 2014-04-01 DIAGNOSIS — F411 Generalized anxiety disorder: Secondary | ICD-10-CM

## 2014-04-01 DIAGNOSIS — I2109 ST elevation (STEMI) myocardial infarction involving other coronary artery of anterior wall: Secondary | ICD-10-CM

## 2014-04-01 LAB — HEPARIN LEVEL (UNFRACTIONATED): Heparin Unfractionated: 0.11 IU/mL — ABNORMAL LOW (ref 0.30–0.70)

## 2014-04-01 MED ORDER — HEPARIN BOLUS VIA INFUSION
1400.0000 [IU] | Freq: Once | INTRAVENOUS | Status: AC
  Administered 2014-04-01: 1400 [IU] via INTRAVENOUS
  Filled 2014-04-01: qty 1400

## 2014-04-01 MED ORDER — LORAZEPAM 0.5 MG PO TABS
0.5000 mg | ORAL_TABLET | Freq: Four times a day (QID) | ORAL | Status: DC | PRN
  Administered 2014-04-01 – 2014-04-03 (×2): 0.5 mg via ORAL
  Filled 2014-04-01 (×3): qty 1

## 2014-04-01 MED ORDER — CITALOPRAM HYDROBROMIDE 10 MG PO TABS
10.0000 mg | ORAL_TABLET | Freq: Every day | ORAL | Status: DC
  Filled 2014-04-01: qty 1

## 2014-04-01 MED ORDER — SERTRALINE HCL 25 MG PO TABS
25.0000 mg | ORAL_TABLET | Freq: Every day | ORAL | Status: DC
  Administered 2014-04-01 – 2014-04-07 (×7): 25 mg via ORAL
  Filled 2014-04-01 (×7): qty 1

## 2014-04-01 MED ORDER — HEPARIN (PORCINE) IN NACL 100-0.45 UNIT/ML-% IJ SOLN
800.0000 [IU]/h | INTRAMUSCULAR | Status: DC
Start: 2014-04-01 — End: 2014-04-02
  Administered 2014-04-01: 600 [IU]/h via INTRAVENOUS
  Filled 2014-04-01 (×2): qty 250

## 2014-04-01 NOTE — Progress Notes (Signed)
ANTICOAGULATION CONSULT NOTE - Initial Consult  Pharmacy Consult for heparin Indication: chest pain/ACS  Allergies  Allergen Reactions  . Alprazolam Other (See Comments)    "puts me to sleep for days"  . Caffeine Other (See Comments)    "peps me up"    Patient Measurements: Height: 5' (152.4 cm) Weight: 109 lb 2 oz (49.5 kg) IBW/kg (Calculated) : 45.5  Vital Signs: Temp: 98.4 F (36.9 C) (07/16 0725) Temp src: Oral (07/16 0725) BP: 121/88 mmHg (07/16 0800) Pulse Rate: 97 (07/16 0600)  Labs:  Recent Labs  03/30/14 1445 03/30/14 2000 03/30/14 2113 03/31/14 0045 03/31/14 0950  HGB 14.9  --   --  14.2  --   HCT 45.1  --   --  42.5  --   PLT 303  --   --  284  --   LABPROT  --  18.1*  --   --   --   INR  --  1.50*  --   --   --   CREATININE 0.68  --   --  0.57 0.59  TROPONINI 5.21*  --  >20.00* 16.02* 7.47*    Estimated Creatinine Clearance: 38.3 ml/min (by C-G formula based on Cr of 0.59).   Medical History: Past Medical History  Diagnosis Date  . Essential hypertension, benign   . Anemia     Postoperative; hemoglobin of 11.5 in 04/2011  . LVH (left ventricular hypertrophy)     HOCM physiology  . Anxiety   . Hyperlipidemia   . Arthritis   . Palpitations   . Anxiety     Medications:  Scheduled:  . antiseptic oral rinse  15 mL Mouth Rinse BID  . aspirin EC  81 mg Oral Daily  . atorvastatin  80 mg Oral q1800  . carvedilol  6.25 mg Oral BID WC  . furosemide  20 mg Intravenous Daily  . losartan  25 mg Oral Daily  . multivitamin with minerals  1 tablet Oral Daily  . sertraline  25 mg Oral Daily  . ticagrelor  90 mg Oral BID   Infusions:  . sodium chloride 10 mL/hr at 03/30/14 2020    Assessment: 80 yo female s/p cath (7/14) with stent to LAD. Patient with large MI and high risk for apical thrombus to start coumadin. Echo planned.   Patient was on heparin prior to cath at 550 units/hr but patient was tkaen to cath before a heparin level could be  obtained  Goal of Therapy:  Heparin level 0.3-0.7 units/ml Monitor platelets by anticoagulation protocol: Yes   Plan:  -No heparin bolus with recent cath -Begin heparin at 600 units/hr (~ 12 units/kg/hr) -Heparin level in 8 hrs -daily heparin level and CBC -PT/INR in am in case of coumadin plans  Harland GermanAndrew Jenevieve Kirschbaum, Pharm D 04/01/2014 9:53 AM

## 2014-04-01 NOTE — Progress Notes (Signed)
ANTICOAGULATION CONSULT NOTE - Follow Up Consult  Pharmacy Consult for heparin Indication: chest pain/ACS  Allergies  Allergen Reactions  . Alprazolam Other (See Comments)    "puts me to sleep for days"  . Caffeine Other (See Comments)    "peps me up"    Patient Measurements: Height: 5' (152.4 cm) Weight: 109 lb 2 oz (49.5 kg) IBW/kg (Calculated) : 45.5 Heparin Dosing Weight: 49.5 Vital Signs: Temp: 98.2 F (36.8 C) (07/16 1629) Temp src: Oral (07/16 1629) BP: 108/80 mmHg (07/16 1629) Labs:  Recent Labs  03/30/14 1445 03/30/14 2000 03/30/14 2113 03/31/14 0045 03/31/14 0950 04/01/14 1804  HGB 14.9  --   --  14.2  --   --   HCT 45.1  --   --  42.5  --   --   PLT 303  --   --  284  --   --   LABPROT  --  18.1*  --   --   --   --   INR  --  1.50*  --   --   --   --   HEPARINUNFRC  --   --   --   --   --  0.11*  CREATININE 0.68  --   --  0.57 0.59  --   TROPONINI 5.21*  --  >20.00* 16.02* 7.47*  --     Estimated Creatinine Clearance: 38.3 ml/min (by C-G formula based on Cr of 0.59).  Medications:  Infusions:  . sodium chloride 10 mL/hr at 03/30/14 2020  . heparin 600 Units/hr (04/01/14 1010)    Assessment: 85 YOF on IV heparin for ACS. Initial heparin level is sub-therapeutic at 0.11 on a rate of 600 units/hr. CBC has been stable. No bleeding noted.   Goal of Therapy:  Heparin level 0.3-0.7 units/ml Monitor platelets by anticoagulation protocol: Yes   Plan:  1. Heparin bolus 1400 units x1.  2. Increase heparin drip to 800 units/hr.  3. Heparin level in 8 hours to confirm - ok to with heparin level.  4. Daily heparin level and CBC.   Link SnufferJessica Bertha Lokken, PharmD, BCPS Clinical Pharmacist [phone] 04/01/2014,7:50 PM

## 2014-04-01 NOTE — Progress Notes (Signed)
Patient ID: [name], female   DOB: 08/19/1928, 80 y.o.   MRN: [id number]    Subjective:  Denies SSCP, palpitations or Dyspnea Sitting in chair eating breakfast  Objective:  Filed Vitals:   04/01/14 0400 04/01/14 0500 04/01/14 0600 04/01/14 0725  BP: 118/74 147/66 132/87 105/78  Pulse: 91 88 97   Temp:    98.4 F (36.9 C)  TempSrc:    Oral  Resp: 22 18 21 20   Height:  5' (1.524 m)    Weight:  109 lb 2 oz (49.5 kg)    SpO2: 93% 89% 88% 99%    Intake/Output from previous day:  Intake/Output Summary (Last 24 hours) at 04/01/14 0846 Last data filed at 04/01/14 0700  Gross per 24 hour  Intake   1800 ml  Output    850 ml  Net    950 ml    Physical Exam: Affect appropriate Frail elderly female  HEENT: normal Neck supple with no adenopathy JVP normal no bruits no thyromegaly Lungs basilar crackles  no wheezing and good diaphragmatic motion Heart:  S1/S2 no murmur, no rub, gallop or click PMI normal Abdomen: benighn, BS positve, no tenderness, no AAA  RFA sight A no hematoma  no bruit.  No HSM or HJR Distal pulses intact with no bruits No edema Neuro non-focal Skin warm and dry No muscular weakness   Lab Results: Basic Metabolic Panel:  Recent Labs  40/98/1107/14/15 1445 03/31/14 0045 03/31/14 0950  NA 140 138 140  K 5.1 4.0 3.7  CL 103 98 102  CO2 23 20 22   GLUCOSE 154* 125* 113*  BUN 24* 30* 31*  CREATININE 0.68 0.57 0.59  CALCIUM 9.6 9.3 9.1  MG 2.3  --   --    Liver Function Tests:  Recent Labs  03/31/14 0045  AST 70*  ALT 27  ALKPHOS 63  BILITOT 0.7  PROT 6.7  ALBUMIN 3.3*   CBC:  Recent Labs  03/30/14 1445 03/31/14 0045  WBC 11.6* 13.5*  NEUTROABS 10.1*  --   HGB 14.9 14.2  HCT 45.1 42.5  MCV 94.7 94.4  PLT 303 284   Cardiac Enzymes:  Recent Labs  03/30/14 2113 03/31/14 0045 03/31/14 0950  TROPONINI >20.00* 16.02* 7.47*   Hemoglobin A1C:  Recent Labs  03/30/14 2000  HGBA1C 5.8*   Fasting Lipid Panel:  Recent Labs  03/31/14 0045  CHOL 193  HDL 69  LDLCALC 104*  TRIG 99  CHOLHDL 2.8   Thyroid Function Tests:  Recent Labs  03/30/14 2000  TSH 1.560    Imaging: Portable Chest X-ray 1 View  03/31/2014   CLINICAL DATA:  Shortness of breath, CHF  EXAM: PORTABLE CHEST - 1 VIEW  COMPARISON:  Portable chest x-ray of March 30, 2014  FINDINGS: The lungs are well-expanded. The interstitial markings are less conspicuous today. The retrocardiac region on the left remains dense and partial obscuration of the left hemidiaphragm persists. The cardiac silhouette is mildly enlarged. The pulmonary vascularity remains engorged but is more distinct. The bones are osteopenic.  IMPRESSION: Improving CHF superimposed upon COPD. Left lower lobe atelectasis or infiltrate persists but is slightly less conspicuous.   Electronically Signed   By: David  SwazilandJordan   On: 03/31/2014 07:36   Dg Chest Port 1 View  03/30/2014   CLINICAL DATA:  Myocardial infarction  EXAM: PORTABLE CHEST - 1 VIEW  COMPARISON:  May 10, 2011  FINDINGS: There is underlying emphysematous change. There is mild diffuse interstitial  edema. There is patchy airspace consolidation in the left base. Heart is enlarged with pulmonary venous hypertension. No adenopathy. There is atherosclerotic change in the aorta. Bones are osteoporotic.  IMPRESSION: Evidence of congestive heart failure superimposed on emphysematous change. Consolidation behind the left heart is probably indicative of underlying pneumonia, although this finding could be due to alveolar edema. There is extensive calcification in the aortic arch region.   Electronically Signed   By: Bretta Bang M.D.   On: 03/30/2014 19:44    Cardiac Studies:  ECG:  More ST elevation in anterior leads than 7/14  Anterior MI    Telemetry:  SR ST no VT  Echo:   02/03/14  EF 65%    Medications:   . antiseptic oral rinse  15 mL Mouth Rinse BID  . aspirin EC  81 mg Oral Daily  . atorvastatin  80 mg Oral q1800  .  carvedilol  6.25 mg Oral BID WC  . furosemide  20 mg Intravenous Daily  . losartan  25 mg Oral Daily  . multivitamin with minerals  1 tablet Oral Daily  . ticagrelor  90 mg Oral BID     . 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  Start heparin and consider 3 months of coumadin  F/U echo  In am to r/o thrombus ( non on vgram)  Also needs to be considered for life vest  Will call Wendie Chess 04/01/2014, 8:46 AM

## 2014-04-01 NOTE — Clinical Documentation Improvement (Signed)
Please clarify CHF. Thank you.  Possible Clinical Conditions?  Chronic Systolic Congestive Heart Failure Chronic Diastolic Congestive Heart Failure Chronic Systolic & Diastolic Congestive Heart Failure Acute Systolic Congestive Heart Failure Acute Diastolic Congestive Heart Failure Acute Systolic & Diastolic Congestive Heart Failure Acute on Chronic Systolic Congestive Heart Failure Acute on Chronic Diastolic Congestive Heart Failure Acute on Chronic Systolic & Diastolic Congestive Heart Failure Other Condition________________________________________ Cannot Clinically Determine  Supporting Information:  Risk Factors: Admitted w/MI History of COPD, LVH & Hypertension 7/15 progress note: Needed bipap last night for CHF  7/14 ELink progress note: Acute dyspnea / increased WOB  Pulmonary edema on CXR No history of CHF noted on chart  Signs & Symptoms: Diagnostics: 7/15 Echo: EF 10-15% BNP =10916  Treatment: 7/14BiPAP  Lasix 40 x 1  Metoprolol 5 x 1  Thank You, [name] ,RN Clinical Documentation Specialist:  [phone]  [facility]- [facility] Information Management

## 2014-04-01 NOTE — Progress Notes (Signed)
CRITICAL CARE Performed by: Excell SeltzerFurlow, Yaelis Scharfenberg S   Total critical care time:Terri Jewel Baizeraven, RN Critical care time was exclusive of separately billable procedures and treating other patients.  Critical care was necessary to treat or prevent imminent or life-threatening deterioration.  Critical care was time spent personally by me on the following activities: development of treatment plan with patient and/or surrogate as well as nursing, discussions with consultants, evaluation of patient's response to treatment, examination of patient, obtaining history from patient or surrogate, ordering and performing treatments and interventions, ordering and review of laboratory studies, ordering and review of radiographic studies, pulse oximetry and re-evaluation of patient's condition.

## 2014-04-01 NOTE — Progress Notes (Signed)
CARDIAC REHAB PHASE I   PRE:  Rate/Rhythm: 91 SR    BP: lying 107/59, sitting 114/73     SaO2:   MODE:  Ambulation: 120 ft   POST:  Rate/Rhythm: 106 ST    BP: sitting 116/68     SaO2:   Pt c/o dizziness upon getting OOB. BP stable but dizziness continued. To BR then ambulated 120 ft with RW. Return to bed. Sts she is still dizzy in bed when she moves her head. Also sts she feels her ears are stuffed up. Denied SOB or CP. Seems depressed today. Daughter present and would very much like to pursue SNF so her mother doesn't go home and care for her father. RN put in PT c/s and CSW. Discussed MI/stent. [id number]   Elissa LovettReeve, Bailen Geffre Airport DriveKristan CES, ACSM 04/01/2014 2:53 PM

## 2014-04-02 DIAGNOSIS — I517 Cardiomegaly: Secondary | ICD-10-CM

## 2014-04-02 LAB — PROTIME-INR
INR: 1.1 (ref 0.00–1.49)
Prothrombin Time: 14.2 seconds (ref 11.6–15.2)

## 2014-04-02 LAB — CBC
HEMATOCRIT: 35.5 % — AB (ref 36.0–46.0)
HEMOGLOBIN: 11.6 g/dL — AB (ref 12.0–15.0)
MCH: 30.9 pg (ref 26.0–34.0)
MCHC: 32.7 g/dL (ref 30.0–36.0)
MCV: 94.4 fL (ref 78.0–100.0)
Platelets: 241 10*3/uL (ref 150–400)
RBC: 3.76 MIL/uL — ABNORMAL LOW (ref 3.87–5.11)
RDW: 14 % (ref 11.5–15.5)
WBC: 7.9 10*3/uL (ref 4.0–10.5)

## 2014-04-02 LAB — HEPARIN LEVEL (UNFRACTIONATED)
Heparin Unfractionated: 0.35 IU/mL (ref 0.30–0.70)
Heparin Unfractionated: 0.31 IU/mL (ref 0.30–0.70)

## 2014-04-02 MED ORDER — PERFLUTREN LIPID MICROSPHERE
INTRAVENOUS | Status: AC
  Administered 2014-04-02: 12:00:00
  Filled 2014-04-02: qty 10

## 2014-04-02 MED ORDER — HEPARIN (PORCINE) IN NACL 100-0.45 UNIT/ML-% IJ SOLN
900.0000 [IU]/h | INTRAMUSCULAR | Status: DC
  Administered 2014-04-02 (×2): 800 [IU]/h via INTRAVENOUS
  Administered 2014-04-04 – 2014-04-05 (×3): 900 [IU]/h via INTRAVENOUS
  Filled 2014-04-02 (×6): qty 250

## 2014-04-02 MED ORDER — PERFLUTREN LIPID MICROSPHERE
1.0000 mL | INTRAVENOUS | Status: AC | PRN
  Filled 2014-04-02: qty 10

## 2014-04-02 MED ORDER — POTASSIUM CHLORIDE CRYS ER 20 MEQ PO TBCR
30.0000 meq | EXTENDED_RELEASE_TABLET | Freq: Once | ORAL | Status: AC
  Administered 2014-04-02: 30 meq via ORAL
  Filled 2014-04-02 (×2): qty 1

## 2014-04-02 NOTE — Progress Notes (Signed)
  Echocardiogram 2D Echocardiogram has been performed.  Georgian CoWILLIAMS, Malaysha Arlen 04/02/2014, 12:52 PM

## 2014-04-02 NOTE — Evaluation (Signed)
Physical Therapy Evaluation Patient Details Name: [name] MRN: [id number] DOB: 1928/06/15 Today's Date: 04/02/2014   History of Present Illness  Pt is an 80 y/o female presenting to the ER after what she described as "panic attack" associated with palpitations and SOB, and feeling weak all over. On ER work up her Troponin was 5.2. Still without pain. EKG with new Q waves in ant. Leads and some upslopping st changes. Her symptoms did change with lower chest discomfort to epigastric pain. She also began with more anxiety. Symptoms increased and EKG with now more apparent STEMI ant wall. She also had episode of bradycardia with HR in the 30s very brief time. She was transferred emergently to [facility] for STEMI and emergent cardiac cath.   Clinical Impression  Pt admitted with the above. Pt currently with functional limitations due to the deficits listed below (see PT Problem List). At the time of PT eval pt was able to perform transfers and ambulation with occasional min assist for unsteadiness. Very fatigued from medications as well as from ambulating 400' with cardiac rehab earlier this afternoon. Pt will benefit from skilled PT to increase their independence and safety with mobility to allow discharge to the venue listed below.       Follow Up Recommendations SNF;Supervision for mobility/OOB    Equipment Recommendations  None recommended by PT    Recommendations for Other Services       Precautions / Restrictions Precautions Precautions: Fall Restrictions Weight Bearing Restrictions: No      Mobility  Bed Mobility               General bed mobility comments: Pt received sitting up in recliner  Transfers Overall transfer level: Needs assistance Equipment used: None Transfers: Sit to/from Stand Sit to Stand: Min guard         General transfer comment: Pt demonstrated proper hand placement and safety awareness. Steadying assist only.   Ambulation/Gait Ambulation/Gait  assistance: Min assist Ambulation Distance (Feet): 125 Feet Assistive device: None Gait Pattern/deviations: Step-through pattern;Decreased stride length;Trunk flexed Gait velocity: Decreased Gait velocity interpretation: Below normal speed for age/gender General Gait Details: Occasional min assist for unsteadiness. Pt reports fatigue from ambulating earlier with cardiac rehab  Stairs            Wheelchair Mobility    Modified Rankin (Stroke Patients Only)       Balance Overall balance assessment: Needs assistance Sitting-balance support: Feet supported;No upper extremity supported Sitting balance-Leahy Scale: Good     Standing balance support: No upper extremity supported Standing balance-Leahy Scale: Fair Standing balance comment: Static standing pt does not require assist, however if dynamic activity, somewhat unsteady                             Pertinent Vitals/Pain Vitals stable throughout session.     Home Living Family/patient expects to be discharged to:: Skilled nursing facility Living Arrangements: Spouse/significant other               Additional Comments: Spouse has Parkinson's and they have 24 hour assist for care at home. Pt assists with meals and getting husband to bed at night, otherwise care takers do all physical assist for him.     Prior Function Level of Independence: Independent               Hand Dominance   Dominant Hand: Right    Extremity/Trunk Assessment   Upper  Extremity Assessment: Defer to OT evaluation           Lower Extremity Assessment: Generalized weakness      Cervical / Trunk Assessment: Kyphotic  Communication   Communication: No difficulties  Cognition Arousal/Alertness: Awake/alert Behavior During Therapy: WFL for tasks assessed/performed Overall Cognitive Status: Within Functional Limits for tasks assessed                      General Comments      Exercises         Assessment/Plan    PT Assessment Patient needs continued PT services  PT Diagnosis Difficulty walking;Generalized weakness   PT Problem List Decreased strength;Decreased range of motion;Decreased activity tolerance;Decreased balance;Decreased mobility;Decreased knowledge of use of DME;Decreased safety awareness;Decreased knowledge of precautions  PT Treatment Interventions DME instruction;Gait training;Stair training;Functional mobility training;Therapeutic activities;Therapeutic exercise;Neuromuscular re-education;Patient/family education   PT Goals (Current goals can be found in the Care Plan section) Acute Rehab PT Goals Patient Stated Goal: To return home with her husband when able PT Goal Formulation: With patient/family Time For Goal Achievement: 04/09/14 Potential to Achieve Goals: Good    Frequency Min 2X/week   Barriers to discharge        Co-evaluation               End of Session Equipment Utilized During Treatment: Gait belt Activity Tolerance: Patient limited by fatigue Patient left: in chair;with call bell/phone within reach;with family/visitor present;with nursing/sitter in room Nurse Communication: Mobility status         Time: [id number] PT Time Calculation (min): 15 min   Charges:   PT Evaluation $Initial PT Evaluation Tier I: 1 Procedure PT Treatments $Gait Training: 8-22 mins   PT G CodesRuthann Cancer:          Hamilton, Dulcemaria Bula 04/02/2014, 3:51 PM  Ruthann CancerLaura Hamilton, PT, DPT Acute Rehabilitation Services Pager: [phone]

## 2014-04-02 NOTE — Progress Notes (Signed)
Patient ID: [name], female   DOB: 07/13/1928, 80 y.o.   MRN: [id number]    Subjective:  No chest pain or dyspnea  Very good night sleep  Objective:  Filed Vitals:   04/02/14 0205 04/02/14 0300 04/02/14 0358 04/02/14 0535  BP: 84/38 89/58 93/53  119/76  Pulse: 72 76 83 91  Temp:   97.2 F (36.2 C)   TempSrc:   Axillary   Resp:      Height:      Weight:   106 lb 4.2 oz (48.2 kg)   SpO2: 95% 97% 95% 100%    Intake/Output from previous day:  Intake/Output Summary (Last 24 hours) at 04/02/14 0805 Last data filed at 04/02/14 0700  Gross per 24 hour  Intake    758 ml  Output    650 ml  Net    108 ml    Physical Exam: Affect appropriate Frail elderly female  HEENT: normal Neck supple with no adenopathy JVP normal no bruits no thyromegaly Lungs basilar crackles  no wheezing and good diaphragmatic motion Heart:  S1/S2 no murmur, no rub, gallop or click PMI normal Abdomen: benighn, BS positve, no tenderness, no AAA  RFA sight A no hematoma  no bruit.  No HSM or HJR Distal pulses intact with no bruits No edema Neuro non-focal Skin warm and dry No muscular weakness   Lab Results: Basic Metabolic Panel:  Recent Labs  30/86/5707/14/15 1445 03/31/14 0045 03/31/14 0950  NA 140 138 140  K 5.1 4.0 3.7  CL 103 98 102  CO2 23 20 22   GLUCOSE 154* 125* 113*  BUN 24* 30* 31*  CREATININE 0.68 0.57 0.59  CALCIUM 9.6 9.3 9.1  MG 2.3  --   --    Liver Function Tests:  Recent Labs  03/31/14 0045  AST 70*  ALT 27  ALKPHOS 63  BILITOT 0.7  PROT 6.7  ALBUMIN 3.3*   CBC:  Recent Labs  03/30/14 1445 03/31/14 0045 04/02/14 0323  WBC 11.6* 13.5* 7.9  NEUTROABS 10.1*  --   --   HGB 14.9 14.2 11.6*  HCT 45.1 42.5 35.5*  MCV 94.7 94.4 94.4  PLT 303 284 241   Cardiac Enzymes:  Recent Labs  03/30/14 2113 03/31/14 0045 03/31/14 0950  TROPONINI >20.00* 16.02* 7.47*   Hemoglobin A1C:  Recent Labs  03/30/14 2000  HGBA1C 5.8*   Fasting Lipid Panel:  Recent  Labs  03/31/14 0045  CHOL 193  HDL 69  LDLCALC 104*  TRIG 99  CHOLHDL 2.8   Thyroid Function Tests:  Recent Labs  03/30/14 2000  TSH 1.560    Imaging: No results found.  Cardiac Studies:  ECG:  More ST elevation in anterior leads than 7/14  Anterior MI    Telemetry:  SR ST no VT  Echo:   02/03/14  EF 65%   Pending post MI today   Medications:   . antiseptic oral rinse  15 mL Mouth Rinse BID  . aspirin EC  81 mg Oral Daily  . atorvastatin  80 mg Oral q1800  . carvedilol  6.25 mg Oral BID WC  . furosemide  20 mg Intravenous Daily  . losartan  25 mg Oral Daily  . multivitamin with minerals  1 tablet Oral Daily  . sertraline  25 mg Oral Daily  . ticagrelor  90 mg Oral BID     . sodium chloride 10 mL/hr at 04/01/14 1900  . heparin 800 Units/hr (04/01/14 2030)  Assessment/Plan:  MI:  Late presentation of large anterior MI  EF 20% by vgram Previously normal on echo 5/15  Post PCE/Stent  LAD  Continue ASA and ticagrelor I reviewed her angio and final result was excellent  No chest pain troponin going down   Start coumadin tomorrow Discussed need for 3 blood thinners With patient and daughter  High risk for apical thrombus formation On heparin now   CHF:  Change metoprolol to coreg  Add low dose ACE  And daily diuretic Suppl K   COPD:  Baseline contributes to dyspnea no active wheezing Chol:  On statin  Social:  Discussed with daughter 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  Start heparin and consider 3 months of coumadin  F/U echo  Today  r/o thrombus ( non on vgram)  Also needs to be considered for life vest    Charlton Haws 04/02/2014, 8:05 AM

## 2014-04-02 NOTE — Progress Notes (Signed)
ANTICOAGULATION CONSULT NOTE - Follow Up Consult  Pharmacy Consult for heparin Indication: MI  Labs:  Recent Labs  03/30/14 1445 03/30/14 2000 03/30/14 2113 03/31/14 0045 03/31/14 0950 04/01/14 1804 04/02/14 0323  HGB 14.9  --   --  14.2  --   --  11.6*  HCT 45.1  --   --  42.5  --   --  35.5*  PLT 303  --   --  284  --   --  241  LABPROT  --  18.1*  --   --   --   --  14.2  INR  --  1.50*  --   --   --   --  1.10  HEPARINUNFRC  --   --   --   --   --  0.11* 0.35  CREATININE 0.68  --   --  0.57 0.59  --   --   TROPONINI 5.21*  --  >20.00* 16.02* 7.47*  --   --     Assessment/Plan:  80yo female therapeutic on heparin after rate increase. Will continue gtt at current rate and confirm stable with additional level.   Vernard GamblesVeronda Diedra Sinor, PharmD, BCPS  04/02/2014,4:36 AM

## 2014-04-02 NOTE — Progress Notes (Signed)
ANTICOAGULATION CONSULT NOTE - Follow Up Consult  Pharmacy Consult for heparin Indication: chest pain/ACS  Allergies  Allergen Reactions  . Alprazolam Other (See Comments)    "puts me to sleep for days"  . Caffeine Other (See Comments)    "peps me up"    Patient Measurements: Height: 5' (152.4 cm) Weight: 106 lb 4.2 oz (48.2 kg) IBW/kg (Calculated) : 45.5 Heparin Dosing Weight: 49.5 Vital Signs: Temp: 97.8 F (36.6 C) (07/17 0817) Temp src: Oral (07/17 0817) BP: 128/66 mmHg (07/17 1045) Pulse Rate: 78 (07/17 0817) Labs:  Recent Labs  03/30/14 1445 03/30/14 2000 03/30/14 2113 03/31/14 0045 03/31/14 0950 04/01/14 1804 04/02/14 0323 04/02/14 0950  HGB 14.9  --   --  14.2  --   --  11.6*  --   HCT 45.1  --   --  42.5  --   --  35.5*  --   PLT 303  --   --  284  --   --  241  --   LABPROT  --  18.1*  --   --   --   --  14.2  --   INR  --  1.50*  --   --   --   --  1.10  --   HEPARINUNFRC  --   --   --   --   --  0.11* 0.35 0.31  CREATININE 0.68  --   --  0.57 0.59  --   --   --   TROPONINI 5.21*  --  >20.00* 16.02* 7.47*  --   --   --     Estimated Creatinine Clearance: 38.3 ml/min (by C-G formula based on Cr of 0.59).  Medications:  Infusions:  . sodium chloride 10 mL/hr at 04/01/14 1900  . heparin 800 Units/hr (04/02/14 0800)    Assessment: 85 YOF on IV heparin for ACS. A confirmatory heparin level is 0.31 and at low end of goal.  Goal of Therapy:  Heparin level 0.3-0.7 units/ml Monitor platelets by anticoagulation protocol: Yes   Plan:  -Increase heparin to 850 units/hr to keep in range -Check a heparin level and CBC  Harland GermanAndrew Marae Cottrell, Pharm D 04/02/2014 11:57 AM

## 2014-04-02 NOTE — Clinical Social Work Note (Signed)
CSW received consult for SNF placement. CSW reviewed chart and read case manager's note. Call made to case manager Terri ArenasSarah [name] regarding discharge plan and the current plan is for patient to d/c home. Case manager will advise CSW if d/c plan changes. CSW signing off. Please reconsult if CSW services needed.  Genelle BalVanessa Timika Muench, MSW, LCSW [phone]

## 2014-04-02 NOTE — Progress Notes (Signed)
CARDIAC REHAB PHASE I   PRE:  Rate/Rhythm: 89 SR    BP: sitting 94/55    SaO2: 94 RA  MODE:  Ambulation: 400 ft   POST:  Rate/Rhythm: 103 ST    BP: sitting 128/66     SaO2: 96 RA  Pt c/o sleepiness today but able to walk much better. Steady with RW, normal pace/gait. Increased distance and denied SOB or CP. To recliner after walk. Will continue to follow. [id number]   [name], [name] Picture RocksKristan CES, ACSM 04/02/2014 10:50 AM

## 2014-04-03 DIAGNOSIS — I5022 Chronic systolic (congestive) heart failure: Secondary | ICD-10-CM

## 2014-04-03 DIAGNOSIS — E785 Hyperlipidemia, unspecified: Secondary | ICD-10-CM

## 2014-04-03 DIAGNOSIS — I509 Heart failure, unspecified: Secondary | ICD-10-CM

## 2014-04-03 LAB — BASIC METABOLIC PANEL
Anion gap: 13 (ref 5–15)
BUN: 23 mg/dL (ref 6–23)
CALCIUM: 8.6 mg/dL (ref 8.4–10.5)
CO2: 23 meq/L (ref 19–32)
CREATININE: 0.69 mg/dL (ref 0.50–1.10)
Chloride: 96 mEq/L (ref 96–112)
GFR calc Af Amer: >90 mL/min (ref >90)
GFR calc non Af Amer: 78 mL/min — ABNORMAL LOW (ref >90)
Glucose, Bld: 103 mg/dL — ABNORMAL HIGH (ref 70–99)
Potassium: 4.7 mEq/L (ref 3.7–5.3)
Sodium: 132 mEq/L — ABNORMAL LOW (ref 137–147)

## 2014-04-03 LAB — CBC
HCT: 35.7 % — ABNORMAL LOW (ref 36.0–46.0)
HEMOGLOBIN: 11.8 g/dL — AB (ref 12.0–15.0)
MCH: 31.1 pg (ref 26.0–34.0)
MCHC: 33.1 g/dL (ref 30.0–36.0)
MCV: 94.2 fL (ref 78.0–100.0)
Platelets: 241 10*3/uL (ref 150–400)
RBC: 3.79 MIL/uL — ABNORMAL LOW (ref 3.87–5.11)
RDW: 13.9 % (ref 11.5–15.5)
WBC: 8.3 10*3/uL (ref 4.0–10.5)

## 2014-04-03 LAB — HEPARIN LEVEL (UNFRACTIONATED): Heparin Unfractionated: 0.38 IU/mL (ref 0.30–0.70)

## 2014-04-03 MED ORDER — PANTOPRAZOLE SODIUM 40 MG PO TBEC
40.0000 mg | DELAYED_RELEASE_TABLET | Freq: Every day | ORAL | Status: DC
  Administered 2014-04-03 – 2014-04-07 (×5): 40 mg via ORAL
  Filled 2014-04-03 (×5): qty 1

## 2014-04-03 MED ORDER — WARFARIN SODIUM 2.5 MG PO TABS
2.5000 mg | ORAL_TABLET | Freq: Once | ORAL | Status: AC
  Administered 2014-04-03: 2.5 mg via ORAL
  Filled 2014-04-03: qty 1

## 2014-04-03 MED ORDER — WARFARIN - PHARMACIST DOSING INPATIENT
Freq: Every day | Status: DC
  Administered 2014-04-03 – 2014-04-04 (×2)

## 2014-04-03 NOTE — Progress Notes (Addendum)
Subjective: Denies CP  No SOB   Objective: Filed Vitals:   04/03/14 0400 04/03/14 0425 04/03/14 0500 04/03/14 0730  BP:  111/56  103/59  Pulse:    81  Temp: 98.6 F (37 C)   98.4 F (36.9 C)  TempSrc: Oral   Oral  Resp:      Height:      Weight:   106 lb 11.2 oz (48.4 kg)   SpO2:    92%   Weight change: 7.1 oz (0.2 kg)  Intake/Output Summary (Last 24 hours) at 04/03/14 0755 Last data filed at 04/03/14 0600  Gross per 24 hour  Intake    974 ml  Output   1450 ml  Net   -476 ml   Tele:  SR   General: Alert, awake, oriented x3, in no acute distress Neck:  JVP is mildly increase  Heart: Regular rate and rhythm, without murmurs, rubs, gallops.  Lungs: Clear to auscultation.  No rales or wheezes. Exemities:  No edema.   Neuro: Grossly intact, nonfocal.   Lab Results: Results for orders placed during the hospital encounter of 03/30/14 (from the past 24 hour(s))  HEPARIN LEVEL (UNFRACTIONATED)     Status: None   Collection Time    04/02/14  9:50 AM      Result Value Ref Range   Heparin Unfractionated 0.31  0.30 - 0.70 IU/mL  HEPARIN LEVEL (UNFRACTIONATED)     Status: None   Collection Time    04/03/14  3:06 AM      Result Value Ref Range   Heparin Unfractionated 0.38  0.30 - 0.70 IU/mL  CBC     Status: Abnormal   Collection Time    04/03/14  3:06 AM      Result Value Ref Range   WBC 8.3  4.0 - 10.5 K/uL   RBC 3.79 (*) 3.87 - 5.11 MIL/uL   Hemoglobin 11.8 (*) 12.0 - 15.0 g/dL   HCT 40.935.7 (*) 81.136.0 - 91.446.0 %   MCV 94.2  78.0 - 100.0 fL   MCH 31.1  26.0 - 34.0 pg   MCHC 33.1  30.0 - 36.0 g/dL   RDW 78.213.9  95.611.5 - 21.315.5 %   Platelets 241  150 - 400 K/uL  BASIC METABOLIC PANEL     Status: Abnormal   Collection Time    04/03/14  3:06 AM      Result Value Ref Range   Sodium 132 (*) 137 - 147 mEq/L   Potassium 4.7  3.7 - 5.3 mEq/L   Chloride 96  96 - 112 mEq/L   CO2 23  19 - 32 mEq/L   Glucose, Bld 103 (*) 70 - 99 mg/dL   BUN 23  6 - 23 mg/dL   Creatinine, Ser 0.860.69  0.50  - 1.10 mg/dL   Calcium 8.6  8.4 - 57.810.5 mg/dL   GFR calc non Af Amer 78 (*) >90 mL/min   GFR calc Af Amer >90  >90 mL/min   Anion gap 13  5 - 15    Studies/Results: No results found.  Medications:Reviewed   @PROBHOSP @  1.  CAD.  Patient is s/p large anterior MI  S/p PCI/stent to LAD.   LVEF 15 to 20%  With signi area of apical akinesis Plan for ASA and ticagrelor.  Will begin coumadin today for 3 mon for prevention of apical thrombus.   Will follow  No VT  Would not set up for Life Vest    2.  Chronic systolic CHF  Coreg, ACE  Volume status is not bad.  Continue meds    3.  COPD  4.  HL  ON statin.    5  Dispo.  WIll discuss with PT and SW patient's ability to care for self  Omeprazole prophylactically    [place] tx to floor  LOS: 4 days   Dietrich Pates 04/03/2014, 7:55 AM

## 2014-04-03 NOTE — Progress Notes (Signed)
CARDIAC REHAB PHASE I   PRE:  Rate/Rhythm: 81 SR    BP: sitting right 80/56, left 90/66    SaO2: 94 RA  MODE:  Ambulation: 400 ft   POST:  Rate/Rhythm: 76 on pulse ox    BP: sitting 90/60 left     SaO2: 97 Ra   Pt with low BP, sts she feels weak and tired. Continues to not want to walk or sit up in recliner. No problems walking, steady, quick pace. To recliner after walk. BP still low. ? If HR was lower after walking.  [id number]  Elissa LovettReeve, Sajad Glander HighwoodKristan CES, ACSM 04/03/2014 12:25 PM

## 2014-04-03 NOTE — Progress Notes (Signed)
ANTICOAGULATION CONSULT NOTE - Follow Up Consult  Pharmacy Consult for heparin and warfarin Indication: chest pain/ACS  Allergies  Allergen Reactions  . Alprazolam Other (See Comments)    "puts me to sleep for days"  . Caffeine Other (See Comments)    "peps me up"    Patient Measurements: Height: 5' (152.4 cm) Weight: 106 lb 11.2 oz (48.4 kg) IBW/kg (Calculated) : 45.5 Heparin Dosing Weight: 49.5 Vital Signs: Temp: 98.4 F (36.9 C) (07/18 0730) Temp src: Oral (07/18 0730) BP: 103/59 mmHg (07/18 0730) Pulse Rate: 81 (07/18 0730) Labs:  Recent Labs  03/31/14 0950  04/02/14 0323 04/02/14 0950 04/03/14 0306  HGB  --   --  11.6*  --  11.8*  HCT  --   --  35.5*  --  35.7*  PLT  --   --  241  --  241  LABPROT  --   --  14.2  --   --   INR  --   --  1.10  --   --   HEPARINUNFRC  --   < > 0.35 0.31 0.38  CREATININE 0.59  --   --   --  0.69  TROPONINI 7.47*  --   --   --   --   < > = values in this interval not displayed.  Estimated Creatinine Clearance: 38.3 ml/min (by C-G formula based on Cr of 0.69).  Medications:  Infusions:  . sodium chloride 10 mL/hr at 04/01/14 1900  . heparin 800 Units/hr (04/02/14 1758)    Assessment: 85 YOF on IV heparin for ACS, now s/p PCI/stent to LAD. Due to increased risk for apical thrombus, to begin coumadin x 3 months. HL this am remains therapeutic at 0.38. BL INR 1.1, H/H low but stable, plt wnl and stable with no reported s/s bleeding. Due to patient's age, low weight and elevated AST, will initiate coumadin dosing cautiously.   Goal of Therapy:  Heparin level 0.3-0.7 units/ml Monitor platelets by anticoagulation protocol: Yes   Plan:  - Continue heparin gtt at 800 units/hr - Start warfarin 2.5 mg PO x 1 tonight - Daily HL, INR - Monitor CBC, s/s bleeding  Terri BilletErika K. von [name], PharmD Clinical Pharmacist - Resident Pager: [phone] Pharmacy: [phone] 04/03/2014 8:43 AM

## 2014-04-04 LAB — CBC
HCT: 38 % (ref 36.0–46.0)
HEMOGLOBIN: 12.3 g/dL (ref 12.0–15.0)
MCH: 31.1 pg (ref 26.0–34.0)
MCHC: 32.4 g/dL (ref 30.0–36.0)
MCV: 96 fL (ref 78.0–100.0)
PLATELETS: 250 10*3/uL (ref 150–400)
RBC: 3.96 MIL/uL (ref 3.87–5.11)
RDW: 14 % (ref 11.5–15.5)
WBC: 9.5 10*3/uL (ref 4.0–10.5)

## 2014-04-04 LAB — HEPARIN LEVEL (UNFRACTIONATED)
HEPARIN UNFRACTIONATED: 0.43 [IU]/mL (ref 0.30–0.70)
Heparin Unfractionated: 0.22 IU/mL — ABNORMAL LOW (ref 0.30–0.70)
Heparin Unfractionated: 0.46 IU/mL (ref 0.30–0.70)

## 2014-04-04 LAB — PROTIME-INR
INR: 1 (ref 0.00–1.49)
PROTHROMBIN TIME: 13.2 s (ref 11.6–15.2)

## 2014-04-04 LAB — PRO B NATRIURETIC PEPTIDE: PRO B NATRI PEPTIDE: 3275 pg/mL — AB (ref 0–450)

## 2014-04-04 MED ORDER — WARFARIN SODIUM 2.5 MG PO TABS
2.5000 mg | ORAL_TABLET | Freq: Once | ORAL | Status: AC
  Administered 2014-04-04: 2.5 mg via ORAL
  Filled 2014-04-04: qty 1

## 2014-04-04 MED ORDER — CARVEDILOL 3.125 MG PO TABS
3.1250 mg | ORAL_TABLET | Freq: Two times a day (BID) | ORAL | Status: DC
  Administered 2014-04-04 – 2014-04-07 (×6): 3.125 mg via ORAL
  Filled 2014-04-04 (×8): qty 1

## 2014-04-04 NOTE — Progress Notes (Signed)
ANTICOAGULATION CONSULT NOTE - Follow Up Consult  Pharmacy Consult for heparin Indication: chest pain/ACS  Allergies  Allergen Reactions  . Alprazolam Other (See Comments)    "puts me to sleep for days"  . Caffeine Other (See Comments)    "peps me up"    Patient Measurements: Height: 5' (152.4 cm) Weight: 106 lb 7.7 oz (48.3 kg) IBW/kg (Calculated) : 45.5 Heparin Dosing Weight: 49.5 Vital Signs: Temp: 98.6 F (37 C) (07/19 1348) Temp src: Oral (07/19 1348) BP: 97/52 mmHg (07/19 1348) Pulse Rate: 73 (07/19 1348) Labs:  Recent Labs  04/02/14 0323  04/03/14 0306 04/04/14 0309 04/04/14 1305 04/04/14 2002  HGB 11.6*  --  11.8* 12.3  --   --   HCT 35.5*  --  35.7* 38.0  --   --   PLT 241  --  241 250  --   --   LABPROT 14.2  --   --  13.2  --   --   INR 1.10  --   --  1.00  --   --   HEPARINUNFRC 0.35  < > 0.38 0.22* 0.46 0.43  CREATININE  --   --  0.69  --   --   --   < > = values in this interval not displayed.  Estimated Creatinine Clearance: 38.3 ml/min (by C-G formula based on Cr of 0.69).  Medications:  Infusions:  . sodium chloride Stopped (04/04/14 1631)  . heparin 900 Units/hr (04/04/14 1600)    Assessment: 85 YOF on IV heparin for ACS, now s/p PCI/stent to LAD. Due to increased risk for apical thrombus, started on coumadin x 3 months. HL this am  therapeutic again at 0.46 after rate increase. BL INR 1.1, H/H and plt wnl and stable with no reported s/s bleeding. Due to patient's age, low weight and elevated AST, dosing Coumadin cautiously.   Confirmatory heparin level this evening within goal range.  No bleeding or complications noted per chart notes.  Goal of Therapy:  Heparin level 0.3-0.7 units/ml Monitor platelets by anticoagulation protocol: Yes   Plan:  - Continue heparin gtt at 900 units/hr - Daily HL, INR - Monitor CBC, s/s bleeding  Tad MooreJessica Shelsea Hangartner, Pharm D, BCPS  Clinical Pharmacist Pager [phone]  04/04/2014 8:33  PM

## 2014-04-04 NOTE — Progress Notes (Signed)
ANTICOAGULATION CONSULT NOTE - Follow Up Consult  Pharmacy Consult for heparin Indication: MI  Labs:  Recent Labs  04/02/14 0323 04/02/14 0950 04/03/14 0306 04/04/14 0309  HGB 11.6*  --  11.8* 12.3  HCT 35.5*  --  35.7* 38.0  PLT 241  --  241 250  LABPROT 14.2  --   --  13.2  INR 1.10  --   --  1.00  HEPARINUNFRC 0.35 0.31 0.38 0.22*  CREATININE  --   --  0.69  --     Assessment: 80yo female now subtherapeutic on heparin after three levels at low end of goal, per RN no issues w/ gtt overnight.  Goal of Therapy:  Heparin level 0.3-0.7 units/ml   Plan:  Will increase heparin gtt by 2 units/kg/hr to 900 units/hr and check level in 8hr.  [name], PharmD, BCPS  04/04/2014,4:43 AM

## 2014-04-04 NOTE — Progress Notes (Signed)
SUBJECTIVE:  Feels weak  OBJECTIVE:   Vitals:   Filed Vitals:   04/03/14 1430 04/03/14 2036 04/04/14 0519 04/04/14 0810  BP: 100/55 100/65 89/59 94/58   Pulse: 75 83 87 76  Temp: 98.5 F (36.9 C) 98.9 F (37.2 C) 97.8 F (36.6 C)   TempSrc: Oral Oral Oral   Resp: 18 19 18    Height:      Weight:   106 lb 7.7 oz (48.3 kg)   SpO2: 94% 94% 96%    I&O's:   Intake/Output Summary (Last 24 hours) at 04/04/14 0856 Last data filed at 04/04/14 0730  Gross per 24 hour  Intake 1011.98 ml  Output   2500 ml  Net -1488.02 ml   TELEMETRY: Reviewed telemetry pt in NSR:     PHYSICAL EXAM General: Well developed, well nourished, in no acute distress Head: Eyes PERRLA, No xanthomas.   Normal cephalic and atramatic  Lungs:   Clear bilaterally to auscultation and percussion. Heart:   HRRR S1 S2 Pulses are 2+ & equal. Abdomen: Bowel sounds are positive, abdomen soft and non-tender without masses Extremities:   No clubbing, cyanosis or edema.  DP +1 Neuro: Alert and oriented X 3. Psych:  Good affect, responds appropriately   LABS: Basic Metabolic Panel:  Recent Labs  16/06/9606/18/15 0306  NA 132*  K 4.7  CL 96  CO2 23  GLUCOSE 103*  BUN 23  CREATININE 0.69  CALCIUM 8.6   Liver Function Tests: No results found for this basename: AST, ALT, ALKPHOS, BILITOT, PROT, ALBUMIN,  in the last 72 hours No results found for this basename: LIPASE, AMYLASE,  in the last 72 hours CBC:  Recent Labs  04/03/14 0306 04/04/14 0309  WBC 8.3 9.5  HGB 11.8* 12.3  HCT 35.7* 38.0  MCV 94.2 96.0  PLT 241 250   Cardiac Enzymes: No results found for this basename: CKTOTAL, CKMB, CKMBINDEX, TROPONINI,  in the last 72 hours BNP: No components found with this basename: POCBNP,  D-Dimer: No results found for this basename: DDIMER,  in the last 72 hours Hemoglobin A1C: No results found for this basename: HGBA1C,  in the last 72 hours Fasting Lipid Panel: No results found for this basename: CHOL,  HDL, LDLCALC, TRIG, CHOLHDL, LDLDIRECT,  in the last 72 hours Thyroid Function Tests: No results found for this basename: TSH, T4TOTAL, FREET3, T3FREE, THYROIDAB,  in the last 72 hours Anemia Panel: No results found for this basename: VITAMINB12, FOLATE, FERRITIN, TIBC, IRON, RETICCTPCT,  in the last 72 hours Coag Panel:   Lab Results  Component Value Date   INR 1.00 04/04/2014   INR 1.10 04/02/2014   INR 1.50* 03/30/2014    RADIOLOGY: Portable Chest X-ray 1 View  03/31/2014   CLINICAL DATA:  Shortness of breath, CHF  EXAM: PORTABLE CHEST - 1 VIEW  COMPARISON:  Portable chest x-ray of March 30, 2014  FINDINGS: The lungs are well-expanded. The interstitial markings are less conspicuous today. The retrocardiac region on the left remains dense and partial obscuration of the left hemidiaphragm persists. The cardiac silhouette is mildly enlarged. The pulmonary vascularity remains engorged but is more distinct. The bones are osteopenic.  IMPRESSION: Improving CHF superimposed upon COPD. Left lower lobe atelectasis or infiltrate persists but is slightly less conspicuous.   Electronically Signed   By: David  SwazilandJordan   On: 03/31/2014 07:36   Dg Chest Port 1 View  03/30/2014   CLINICAL DATA:  Myocardial infarction  EXAM: PORTABLE CHEST - 1  VIEW  COMPARISON:  May 10, 2011  FINDINGS: There is underlying emphysematous change. There is mild diffuse interstitial edema. There is patchy airspace consolidation in the left base. Heart is enlarged with pulmonary venous hypertension. No adenopathy. There is atherosclerotic change in the aorta. Bones are osteoporotic.  IMPRESSION: Evidence of congestive heart failure superimposed on emphysematous change. Consolidation behind the left heart is probably indicative of underlying pneumonia, although this finding could be due to alveolar edema. There is extensive calcification in the aortic arch region.   Electronically Signed   By: Bretta Bang M.D.   On: 03/30/2014 19:44    Assessment/Plan: 1. CAD. Patient is s/p large anterior MI S/p PCI/stent to LAD. LVEF 15 to 20% With significant area of apical akinesis Plan for ASA and ticagrelor. Coumadin started for 3 mon for prevention of apical thrombus. Would hold ASA once INR therapeutic on Coumadin and then restart once coumadin d/c'd after 3 months due to need to also be on ticagrelor. Will follow No VT  2. Chronic systolic CHF - continue Coreg .  BNP significantly improved after IV Lasix.  BP very soft this am so will stop Lasix for now.  Hold Losartan and decrease coreg to 3.125mg  BID due to low BP- patient very weak 3. COPD  4. HL ON statin.  5 Dispo.PT recommends SNF - will get case management consult     Quintella Reichert, MD  04/04/2014  8:56 AM

## 2014-04-04 NOTE — Progress Notes (Signed)
ANTICOAGULATION CONSULT NOTE - Follow Up Consult  Pharmacy Consult for heparin and warfarin Indication: chest pain/ACS  Allergies  Allergen Reactions  . Alprazolam Other (See Comments)    "puts me to sleep for days"  . Caffeine Other (See Comments)    "peps me up"    Patient Measurements: Height: 5' (152.4 cm) Weight: 106 lb 7.7 oz (48.3 kg) IBW/kg (Calculated) : 45.5 Heparin Dosing Weight: 49.5 Vital Signs: Temp: 98.6 F (37 C) (07/19 1348) Temp src: Oral (07/19 1348) BP: 97/52 mmHg (07/19 1348) Pulse Rate: 73 (07/19 1348) Labs:  Recent Labs  04/02/14 0323  04/03/14 0306 04/04/14 0309 04/04/14 1305  HGB 11.6*  --  11.8* 12.3  --   HCT 35.5*  --  35.7* 38.0  --   PLT 241  --  241 250  --   LABPROT 14.2  --   --  13.2  --   INR 1.10  --   --  1.00  --   HEPARINUNFRC 0.35  < > 0.38 0.22* 0.46  CREATININE  --   --  0.69  --   --   < > = values in this interval not displayed.  Estimated Creatinine Clearance: 38.3 ml/min (by C-G formula based on Cr of 0.69).  Medications:  Infusions:  . sodium chloride 10 mL/hr at 04/01/14 1900  . heparin 900 Units/hr (04/04/14 0516)    Assessment: 85 YOF on IV heparin for ACS, now s/p PCI/stent to LAD. Due to increased risk for apical thrombus, started on coumadin x 3 months. HL this am  therapeutic again at 0.46 after rate increase. BL INR 1.1, H/H and plt wnl and stable with no reported s/s bleeding. Due to patient's age, low weight and elevated AST, dosing Coumadin cautiously.   Goal of Therapy:  Heparin level 0.3-0.7 units/ml Monitor platelets by anticoagulation protocol: Yes   Plan:  - Continue heparin gtt at 900 units/hr - Repeat warfarin 2.5 mg PO x 1 tonight - F/u 8-hr confirmatory HL  - Daily HL, INR - Monitor CBC, s/s bleeding - Per cards, hold aspirin once INR therapeutic and then restart after coumadin d/c'ed at 3 months    Vinnie LevelBenjamin Asencion Guisinger, PharmD.  Clinical Pharmacist Pager [phone]

## 2014-04-05 DIAGNOSIS — I5021 Acute systolic (congestive) heart failure: Secondary | ICD-10-CM

## 2014-04-05 LAB — CBC
HCT: 34.9 % — ABNORMAL LOW (ref 36.0–46.0)
Hemoglobin: 11.5 g/dL — ABNORMAL LOW (ref 12.0–15.0)
MCH: 31.1 pg (ref 26.0–34.0)
MCHC: 33 g/dL (ref 30.0–36.0)
MCV: 94.3 fL (ref 78.0–100.0)
PLATELETS: 225 10*3/uL (ref 150–400)
RBC: 3.7 MIL/uL — ABNORMAL LOW (ref 3.87–5.11)
RDW: 14.2 % (ref 11.5–15.5)
WBC: 9.6 10*3/uL (ref 4.0–10.5)

## 2014-04-05 LAB — PROTIME-INR
INR: 1.05 (ref 0.00–1.49)
Prothrombin Time: 13.7 seconds (ref 11.6–15.2)

## 2014-04-05 LAB — HEPARIN LEVEL (UNFRACTIONATED): Heparin Unfractionated: 0.65 IU/mL (ref 0.30–0.70)

## 2014-04-05 MED ORDER — PNEUMOCOCCAL VAC POLYVALENT 25 MCG/0.5ML IJ INJ
0.5000 mL | INJECTION | INTRAMUSCULAR | Status: AC
  Administered 2014-04-07: 0.5 mL via INTRAMUSCULAR
  Filled 2014-04-05: qty 0.5

## 2014-04-05 MED ORDER — WARFARIN SODIUM 4 MG PO TABS
4.0000 mg | ORAL_TABLET | Freq: Once | ORAL | Status: AC
  Administered 2014-04-05: 4 mg via ORAL
  Filled 2014-04-05: qty 1

## 2014-04-05 NOTE — Progress Notes (Signed)
CARDIAC REHAB PHASE I   PRE:  Rate/Rhythm: 76 SR  BP:  Supine: 102/60  Sitting:   Standing:    SaO2: 96%RA  MODE:  Ambulation: 550 ft   POST:  Rate/Rhythm: 95 SR  BP:  Supine:   Sitting: 108/62  Standing:    SaO2: 94%RA 0930-1002 Pt stated very sleepy. Assisted to bathroom and then she walked 550 ft on RA with rolling walker and asst x 1. Gait fairly steady. No dizziness, just sleepy. NT in to give pt bath and daughter left to take a break, so did not begin CHF ed. Will follow up tomorrow.[name].   Braydn Carneiro, RN BSN  04/05/2014 9:58 AM

## 2014-04-05 NOTE — Discharge Instructions (Addendum)

## 2014-04-05 NOTE — Progress Notes (Signed)
CARE MANAGEMENT NOTE 04/05/2014  Patient:  [name],[name]   Account Number:  [id number]  Date Initiated:  03/31/2014  Documentation initiated by:  Junius CreamerWELL,DEBBIE  Subjective/Objective Assessment:   adm w mi     Action/Plan:   lives w fam, pcp dr Karleen Hampshirewilliam mcgough   Anticipated DC Date:  04/05/2014   Anticipated DC Plan:  HOME W HOME HEALTH SERVICES      DC Planning Services  CM consult  Medication Assistance      [facility]  [facility]   Choice offered to / List presented to:  C-1 Patient           Status of service:  Completed, signed off Medicare Important Message given?  YES (If response is "NO", the following Medicare IM given date fields will be blank) Date Medicare IM given:  04/02/2014 Medicare IM given by:  [facility],SARAH Date Additional Medicare IM given:  04/05/2014 Additional Medicare IM given by:  Isidoro DonningALESIA Dnyla Antonetti  Discharge Disposition:  SKILLED NURSING FACILITY  Per UR Regulation:  Reviewed for med. necessity/level of care/duration of stay  If discussed at Long Length of Stay Meetings, dates discussed:    Comments:  04/05/2014 1500 NCM faxed H&P, facesheet and progress notes to [facility]. [facility] order form placed on shadow chart for MD to sign. Will fax when order signed. Will need order in EPIC for [facility]. Pt plan is SNF. Pt states she prefers dc to home with HH. Requested [facility] if dc home. Isidoro DonningAlesia Zaiya Annunziato RN CCM Case Mgmt phone [phone]  04-02-14 9:15am Avie ArenasSarah Brown, RNBSN - [phone] Husband  has parkinson - daughter lives close, they have a care giver 24/7. Privately paid.  [place] or [place].  Physician talked to about considration of facility. [facility].  States home works well.  Large late presentation MI. Still on heparin. - May need [facility].   CM will continue to follow  7/15 1131a debbie dowell rn.bsn gave pt 30day free brilinta card. pt will have 64.00 per month copay. no prior auth req. ContactJules Husbands:   Deeds,William W Spouse [phone]                 Cogliano,Susan Daughter [phone]

## 2014-04-05 NOTE — Progress Notes (Signed)
Physical Therapy Treatment Patient Details Name: [name] MRN: [id number] DOB: 08/29/1928 Today's Date: 04/05/2014    History of Present Illness Pt adm with STEMI. Underwent cardiac cath with PCI. PMH - HTN, COPD, hipfx    PT Comments    Pt making good progress with mobility.  Follow Up Recommendations  SNF     Equipment Recommendations  None recommended by PT    Recommendations for Other Services       Precautions / Restrictions Precautions Precautions: Fall    Mobility  Bed Mobility Overal bed mobility: Independent                Transfers Overall transfer level: Needs assistance Equipment used: Rolling walker (2 wheeled) Transfers: Sit to/from Stand Sit to Stand: Supervision            Ambulation/Gait Ambulation/Gait assistance: Supervision Ambulation Distance (Feet): 350 Feet Assistive device: Rolling walker (2 wheeled) Gait Pattern/deviations: Step-through pattern;Decreased stride length Gait velocity: Decreased   General Gait Details: Improved stability using rolling walker.   Stairs            Wheelchair Mobility    Modified Rankin (Stroke Patients Only)       Balance           Standing balance support: No upper extremity supported Standing balance-Leahy Scale: Fair                      Cognition Arousal/Alertness: Awake/alert Behavior During Therapy: WFL for tasks assessed/performed Overall Cognitive Status: Within Functional Limits for tasks assessed                      Exercises      General Comments        Pertinent Vitals/Pain VSS    Home Living                      Prior Function            PT Goals (current goals can now be found in the care plan section) Progress towards PT goals: Progressing toward goals    Frequency  Min 2X/week    PT Plan Current plan remains appropriate    Co-evaluation             End of Session   Activity Tolerance: Patient  tolerated treatment well Patient left: in bed;with call bell/phone within reach     Time: 1551-1604 PT Time Calculation (min): 13 min  Charges:  $Gait Training: 8-22 mins                    G Codes:      Nakyla Bracco 04/05/2014, 4:22 PM  [facility] Malayna Noori PT [phone]

## 2014-04-05 NOTE — Progress Notes (Signed)
ANTICOAGULATION CONSULT NOTE - Follow Up Consult  Pharmacy Consult for heparin and coumadin Indication: prevention of apical thrombus  Allergies  Allergen Reactions  . Alprazolam Other (See Comments)    "puts me to sleep for days"  . Caffeine Other (See Comments)    "peps me up"    Patient Measurements: Height: 5' (152.4 cm) Weight: 107 lb 3.2 oz (48.626 kg) IBW/kg (Calculated) : 45.5 Heparin Dosing Weight: 49 kg  Vital Signs: Temp: 98.6 F (37 C) (07/20 0500) Temp src: Oral (07/20 0500) BP: 108/66 mmHg (07/20 0500) Pulse Rate: 82 (07/20 0500)  Labs:  Recent Labs  04/03/14 0306 04/04/14 0309 04/04/14 1305 04/04/14 2002 04/05/14 0338  HGB 11.8* 12.3  --   --  11.5*  HCT 35.7* 38.0  --   --  34.9*  PLT 241 250  --   --  225  LABPROT  --  13.2  --   --  13.7  INR  --  1.00  --   --  1.05  HEPARINUNFRC 0.38 0.22* 0.46 0.43 0.65  CREATININE 0.69  --   --   --   --     Estimated Creatinine Clearance: 38.3 ml/min (by C-G formula based on Cr of 0.69).  Assessment: Patient is an 80 y.o F s/p MI and PCI/stent to LAD on 7/14 currently on coumadin and heparin for prevention of apical thrombus.  Heparin level is therapeutic at 0.65 and INR remains sub-therapeutic at 1.05 after two doses of 2.5mg .  CBC stable, no bleeding documented.  Goal of Therapy:  INR 2-3 Heparin level 0.3-0.7 units/ml Monitor platelets by anticoagulation protocol: Yes   Plan:  1) increase coumadin to 4mg  PO x1 today 2) continue heparin at 900 units/hr  Arlisa Leclere P 04/05/2014,8:50 AM

## 2014-04-05 NOTE — Progress Notes (Signed)
Patient Profile: 80 year old female with hx of HTN, HLD and COPD admitted 03/30/14 for anterior wall STEMI, secondary to total proximal LAD occlusion. S/p PCI + DES to proximal LAD. Also with residual 40-50% diagonal 2 stenosis and 40% LAD stenosis. Severe LV dysfunction with an EF of 15-20%. There is akinesis of the anteroseptal and apical myocardium. There is akinesis of the mid-apicalinferolateral and inferior myocardium. Grade 1 diastolic dysfunction. No evidence of thrombus with use of Definity contrast on 2D echo.   Subjective: Denies CP and SOB.   Objective: Vital signs in last 24 hours: Temp:  [98.6 F (37 C)-98.9 F (37.2 C)] 98.6 F (37 C) (07/20 0500) Pulse Rate:  [73-82] 82 (07/20 0500) Resp:  [17-18] 17 (07/20 0500) BP: (94-108)/(52-66) 108/66 mmHg (07/20 0500) SpO2:  [93 %-97 %] 93 % (07/20 0500) Weight:  [107 lb 3.2 oz (48.626 kg)] 107 lb 3.2 oz (48.626 kg) (07/20 0500) Last BM Date: 04/04/14  Intake/Output from previous day: 07/19 0701 - 07/20 0700 In: 868.8 [P.O.:720; I.V.:148.8] Out: 1900 [Urine:1900] Intake/Output this shift: Total I/O In: 54 [I.V.:54] Out: 550 [Urine:550]  Medications Current Facility-Administered Medications  Medication Dose Route Frequency Provider Last Rate Last Dose  . 0.9 %  sodium chloride infusion   Intravenous Continuous Nada BoozerLaura Ingold, NP 10 mL/hr at 04/04/14 2201    . acetaminophen (TYLENOL) tablet 650 mg  650 mg Oral Q4H PRN Nada BoozerLaura Ingold, NP      . aspirin EC tablet 81 mg  81 mg Oral Daily Lennette Biharihomas A Kelly, MD   81 mg at 04/04/14 1042  . atorvastatin (LIPITOR) tablet 80 mg  80 mg Oral q1800 Lennette Biharihomas A Kelly, MD   80 mg at 04/04/14 1700  . carvedilol (COREG) tablet 3.125 mg  3.125 mg Oral BID WC Quintella Reichertraci R Turner, MD   3.125 mg at 04/04/14 1658  . heparin ADULT infusion 100 units/mL (25000 units/250 mL)  900 Units/hr Intravenous Continuous Colleen CanVeronda Pauline Bryk, RPH 9 mL/hr at 04/04/14 2201 900 Units/hr at 04/04/14 2201  . LORazepam (ATIVAN)  tablet 0.5 mg  0.5 mg Oral Q6H PRN Wendall StadePeter C Nishan, MD   0.5 mg at 04/03/14 2346  . multivitamin with minerals tablet 1 tablet  1 tablet Oral Daily Nada BoozerLaura Ingold, NP   1 tablet at 04/04/14 1042  . nitroGLYCERIN (NITROSTAT) SL tablet 0.4 mg  0.4 mg Sublingual Q5 Min x 3 PRN Nada BoozerLaura Ingold, NP      . ondansetron [facility](ZOFRAN) injection 4 mg  4 mg Intravenous Q6H PRN Nada BoozerLaura Ingold, NP   4 mg at 04/03/14 2130  . pantoprazole (PROTONIX) EC tablet 40 mg  40 mg Oral Daily Pricilla RifflePaula Ross V, MD   40 mg at 04/04/14 1042  . sertraline (ZOLOFT) tablet 25 mg  25 mg Oral Daily Wendall StadePeter C Nishan, MD   25 mg at 04/04/14 1043  . ticagrelor (BRILINTA) tablet 90 mg  90 mg Oral BID Lennette Biharihomas A Kelly, MD   90 mg at 04/04/14 2158  . Warfarin - Pharmacist Dosing Inpatient   Does not apply q1800 Margie BilletErika K von Vajna, [facility]enn[facility] HospitalRPH        PE: General appearance: alert, cooperative and no distress Neck: mild JVD Lungs: faint rales at LLL that clear after cough Heart: regular rate and rhythm Extremities: no LEE Pulses: 2+ and symmetric Skin: warm and dry Neurologic: Grossly normal  Lab Results:   Recent Labs  04/03/14 0306 04/04/14 0309 04/05/14 0338  WBC 8.3 9.5 9.6  HGB 11.8* 12.3  11.5*  HCT 35.7* 38.0 34.9*  PLT 241 250 225   BMET  Recent Labs  04/03/14 0306  NA 132*  K 4.7  CL 96  CO2 23  GLUCOSE 103*  BUN 23  CREATININE 0.69  CALCIUM 8.6   PT/INR  Recent Labs  04/04/14 0309 04/05/14 0338  LABPROT 13.2 13.7  INR 1.00 1.05    Assessment/Plan  Principal Problem:   ST elevation myocardial infarction (STEMI) of anterior wall Active Problems:   Hypertension   LVH (left ventricular hypertrophy)   Palpitations   Anxiety   Hyperlipidemia   ST elevation myocardial infarction (STEMI) involving left anterior descending (LAD) coronary artery with complication  1. STEMI: S/p DES to proximal LAD. Severe systolic dysfunction with EF of 15-20%. She is currently on triple  therapy with ASA + Brilinta for DES and Wafarin  for LV thrombus prophylaxis. IV heparin also going to bridge Warfarin. Warfarin to be discontinued, in 3 months, to prevent risk of bleed in this 80 y/o patient.  NSR on telemetry. No arrhthymias in last 12 hr. ? Need for LifeVest.   2. CAD: Continue Brilinta, BB and statin  3. Acute Combined Systolic + Diastolic HF: EF 15-20%. Continue BB. Will need initiation of ACE/ARB when BP allows. BP is still soft. She is currently not on a standing dose of diuretic. Will educated on importance of low sodium diet and daily weights to prevent acute exacerbations.   4. HTN: controlled.   5. HLD: LDL elevated at 104. Goal is < 70. Continue high dose Lipitor.   6. Warfarin Therapy: INR subtherapeutic at 1.05. Continue heparin bridge.  Dispo: SNF once INR therapeutic.   LOS: 6 days    Brittainy M. Delmer Islam 04/05/2014 6:11 AM Patient seen and examined and history/physical reviewed and amended. Agree with above findings and plan. Doing very well. No chest pain or SOB. No arrhythmia. Waiting for therapeutic INR. Continue heparin bridge for now. Needs to continue DAPT with ASA and Brilinta. If patient experiences any bleeding will need to stop Coumadin since this is prophylactic only. When INR therapeutic plan for DC to SNF since patient's husband has severe Parkinson's disease with home care and family feels this environment would be too stressful for her. Patient is post MI with significant LV dysfunction. I do think she should be considered for a Lifevest and will arrange this at discharge. Discussed with patient and daughter who have researched this and are agreeable.   Raysean Graumann [place], MDFACC 04/05/2014 12:37 PM

## 2014-04-06 DIAGNOSIS — R040 Epistaxis: Secondary | ICD-10-CM

## 2014-04-06 LAB — CBC
HEMATOCRIT: 36.6 % (ref 36.0–46.0)
HEMOGLOBIN: 11.9 g/dL — AB (ref 12.0–15.0)
MCH: 31 pg (ref 26.0–34.0)
MCHC: 32.5 g/dL (ref 30.0–36.0)
MCV: 95.3 fL (ref 78.0–100.0)
Platelets: 223 10*3/uL (ref 150–400)
RBC: 3.84 MIL/uL — ABNORMAL LOW (ref 3.87–5.11)
RDW: 14.3 % (ref 11.5–15.5)
WBC: 9.1 10*3/uL (ref 4.0–10.5)

## 2014-04-06 LAB — PROTIME-INR
INR: 1.15 (ref 0.00–1.49)
Prothrombin Time: 14.7 seconds (ref 11.6–15.2)

## 2014-04-06 LAB — HEPARIN LEVEL (UNFRACTIONATED): Heparin Unfractionated: 1.05 IU/mL — ABNORMAL HIGH (ref 0.30–0.70)

## 2014-04-06 MED ORDER — HEPARIN (PORCINE) IN NACL 100-0.45 UNIT/ML-% IJ SOLN
750.0000 [IU]/h | INTRAMUSCULAR | Status: DC
  Filled 2014-04-06: qty 250

## 2014-04-06 MED ORDER — LISINOPRIL 2.5 MG PO TABS
2.5000 mg | ORAL_TABLET | Freq: Every day | ORAL | Status: DC
  Administered 2014-04-06 – 2014-04-07 (×2): 2.5 mg via ORAL
  Filled 2014-04-06 (×2): qty 1

## 2014-04-06 NOTE — Progress Notes (Signed)
ANTICOAGULATION CONSULT NOTE - Follow Up Consult  Pharmacy Consult for heparin Indication: prevention of apical thrombus  Labs:  Recent Labs  04/04/14 0309  04/04/14 2002 04/05/14 0338 04/06/14 0426  HGB 12.3  --   --  11.5* 11.9*  HCT 38.0  --   --  34.9* 36.6  PLT 250  --   --  225 223  LABPROT 13.2  --   --  13.7 14.7  INR 1.00  --   --  1.05 1.15  HEPARINUNFRC 0.22*  < > 0.43 0.65 1.05*  < > = values in this interval not displayed.   Assessment: 80yo female had been within goal on heparin but had been trending up, overnight had an episode of epistaxis x~1710min and was controllable, spoke w/ RN and we decided to have am labs drawn early, as suspected level has increased to above goal, Hgb stable.  Goal of Therapy:  Heparin level 0.3-0.7 units/ml   Plan:  Will hold heparin gtt x7145min then resume heparin gtt at reduced rate of 750 units/hr and check level in 8hr; was previously subtherapeutic below 900 units/hr but is apparently accumulating.  Vernard GamblesVeronda Kiauna Zywicki, PharmD, BCPS  04/06/2014,5:16 AM

## 2014-04-06 NOTE — Progress Notes (Signed)
1020-1055 Came to see pt to walk. She had nose bleed earlier and had not felt good. Still dabbing at nose. Encouraged her not to blow nose. Did CHF ed with pt and daughter and discussed importance of daily weights and low sodium diet. Gave diet handouts and CHF booklet. Reviewed yellow zone and when to call MD. Re enforced importance of taking brilinta with stent. Reviewed NTG use. Wrote down video information and how to view life vest video. Heated up oatmeal and encouraged pt to eat her breakfast. Encouraged walks with staff later. Pt's daughter stated will see that she walks later. Pt a little down today. Luetta Nuttingharlene Mayo Owczarzak RN BSN 04/06/2014 10:56 AM

## 2014-04-06 NOTE — Progress Notes (Addendum)
CSW provided bed offers to patient and patient's daughter by bedside. Daughter states she would like to speak with family regarding choice. Daughter asked social worker to call their family member and provide this information to them as well. CSW attempted to call and left a voicemail. CSW continuing to follow patient for dc to SNF.  Update: CSW spoke to patient's other daughter over the phone and provided bed offers to daughter. CSW informed family of possible dc tomorrow. Daughter states she will talk it over with family and come to a decision by tomorrow morning.  Maree KrabbeLindsay Kaid Seeberger, MSW, Theresia MajorsLCSWA [phone]

## 2014-04-06 NOTE — Progress Notes (Signed)
Clinical Social Work Department BRIEF PSYCHOSOCIAL ASSESSMENT 04/06/2014  Patient:  [name], [name]     Account Number:  [id number]     Admit date:  03/30/2014  Clinical Social Worker:  Megan Salon  Date/Time:  04/06/2014 10:58 AM  Referred by:  Physician  Date Referred:  04/06/2014 Referred for  SNF Placement   Other Referral:   Interview type:  Other - See comment Other interview type:   CSW spoke to patient and patient's daughter by bedside    PSYCHOSOCIAL DATA Living Status:  FAMILY Admitted from facility:   Level of care:   Primary support name:  Katrinka Herbison Primary support relationship to patient:  CHILD, ADULT Degree of support available:   Good    CURRENT CONCERNS Current Concerns  Post-Acute Placement  Post-Acute Placement   Other Concerns:    SOCIAL WORK ASSESSMENT / PLAN Clinical Social Worker received referral for SNF placement at d/c. Clinical Social Worker met with patient at bedside to offer support and discuss patient needs at discharge.  CSW introduced self and explained reason for visit. Patient had visitor by bedside, patient's daughter.  CSW explained SNF process to patient and patient's daughter. CSW explained that if patient needs a life vest, SNF facilities may be limited. Daughter states that if patient has to look in [facility] as well as [facility] that is okay. Patient reported she is agreeable for SNF placement. CSW will complete FL2 for MD's signature and will update patient and family when bed offers are received.    CSW remains available for support and to facilitate patient discharge needs once medically ready.   Assessment/plan status:  Psychosocial Support/Ongoing Assessment of Needs Other assessment/ plan:   Information/referral to community resources:   SNF information/CSW contact info    PATIENT'S/FAMILY'S RESPONSE TO PLAN OF CARE: Patient states she has been to [facility] before and would prefer that facility if they  accept patients with a life vest.        Jeanette Caprice, MSW, [place]

## 2014-04-06 NOTE — Progress Notes (Signed)
Clinical Social Work Department CLINICAL SOCIAL WORK PLACEMENT NOTE 04/06/2014  Patient:  [name],Terri Y  Account Number:  [id number] Admit date:  03/30/2014  Clinical Social Worker:  Carren RangLINDSAY Dawanna Grauberger, LCSWA  Date/time:  04/06/2014 11:01 AM  Clinical Social Work is seeking post-discharge placement for this patient at the following level of care:   SKILLED NURSING   (*CSW will update this form in Epic as items are completed)   04/06/2014  [facility]/family provided with [facility] of Clinical Social Work's list of facilities offering this level of care within the geographic area requested by the patient (or if unable, by the patient's family).  04/06/2014  Patient/family informed of their freedom to choose among providers that offer the needed level of care, that participate in Medicare, Medicaid or managed care program needed by the patient, have an available bed and are willing to accept the patient.  04/06/2014  Patient/family informed of [facility]' ownership interest in [facility], as well as of the fact that they are under no obligation to receive care at this facility.  PASARR submitted to EDS on 04/06/2014 PASARR number received on 04/06/2014  FL2 transmitted to all facilities in geographic area requested by pt/family on  04/06/2014 FL2 transmitted to all facilities within larger geographic area on   Patient informed that his/her managed care company has contracts with or will negotiate with  certain facilities, including the following:     Patient/family informed of bed offers received:   Patient chooses bed at  Physician recommends and patient chooses bed at    Patient to be transferred to  on   Patient to be transferred to facility by  Patient and family notified of transfer on  Name of family member notified:    The following physician request were entered in Epic:   Additional Comments:  Maree KrabbeLindsay Zunaira Lamy, MSW, [employer] IncLCSWA [phone]

## 2014-04-06 NOTE — Progress Notes (Signed)
Pt ambulating hall with RW and family assisting.

## 2014-04-06 NOTE — Progress Notes (Signed)
Patient having second nosebleed.  Nose is bleeding in continuous flow.  Patient VSS,Sinus Rhythm,no chest pain, lightheadedness or dizziness. RN notified PA on call.  RN notified by PA to put heparin on hold until Cardiology makes rounds. Also to continue to apply pressure on nose.  RN notified pharmacy of PA orders to hold Heparin. RN will continue to monitor patient

## 2014-04-06 NOTE — Progress Notes (Signed)
Patient called out for assistance.  Patient having blood dripping from her nose.  Patient denied chest pain, lightheadedness, difficulty breathing. Patient remains in Normal Sinus Rhythm.  Nose bled for approximately 7-10 minutes.  Pharmacy notified of patient nosebleed.  No new orders received, waiting on AM lab draws.  RN will continue to monitor patient

## 2014-04-06 NOTE — Progress Notes (Signed)
Patient Profile: 80 year old female with hx of HTN, HLD and COPD admitted 03/30/14 for anterior wall STEMI, secondary to total proximal LAD occlusion. S/p PCI + DES to proximal LAD. Also with residual 40-50% diagonal 2 stenosis and 40% LAD stenosis. Severe LV dysfunction with an EF of 15-20%. There is akinesis of the anteroseptal and apical myocardium. There is akinesis of the mid-apicalinferolateral and inferior myocardium. Grade 1 diastolic dysfunction. No evidence of thrombus with use of Definity contrast on 2D echo.   Subjective: Denies CP and SOB. Developed nose bleed at 3 am. Still has mild oozing.   Objective: Vital signs in last 24 hours: Temp:  [98.1 F (36.7 C)-98.8 F (37.1 C)] 98.8 F (37.1 C) (07/21 0451) Pulse Rate:  [78-89] 89 (07/21 0451) Resp:  [18] 18 (07/21 0451) BP: (88-143)/(56-85) 143/85 mmHg (07/21 0451) SpO2:  [94 %-98 %] 94 % (07/21 0451) Weight:  [107 lb 12.9 oz (48.9 kg)] 107 lb 12.9 oz (48.9 kg) (07/21 0451) Last BM Date: 04/04/14  Intake/Output from previous day: 07/20 0701 - 07/21 0700 In: 783 [P.O.:720; I.V.:63] Out: 1000 [Urine:1000] Intake/Output this shift:    Medications Current Facility-Administered Medications  Medication Dose Route Frequency Provider Last Rate Last Dose  . 0.9 %  sodium chloride infusion   Intravenous Continuous Nada Boozer, NP 10 mL/hr at 04/05/14 1553    . acetaminophen (TYLENOL) tablet 650 mg  650 mg Oral Q4H PRN Nada Boozer, NP      . aspirin EC tablet 81 mg  81 mg Oral Daily Lennette Bihari, MD   81 mg at 04/05/14 1041  . atorvastatin (LIPITOR) tablet 80 mg  80 mg Oral q1800 Lennette Bihari, MD   80 mg at 04/05/14 1710  . carvedilol (COREG) tablet 3.125 mg  3.125 mg Oral BID WC Quintella Reichert, MD   3.125 mg at 04/05/14 1710  . LORazepam (ATIVAN) tablet 0.5 mg  0.5 mg Oral Q6H PRN Wendall Stade, MD   0.5 mg at 04/03/14 2346  . multivitamin with minerals tablet 1 tablet  1 tablet Oral Daily Nada Boozer, NP   1 tablet at  04/05/14 1041  . nitroGLYCERIN (NITROSTAT) SL tablet 0.4 mg  0.4 mg Sublingual Q5 Min x 3 PRN Nada Boozer, NP      . ondansetron [facility]) injection 4 mg  4 mg Intravenous Q6H PRN Nada Boozer, NP   4 mg at 04/03/14 2130  . pantoprazole (PROTONIX) EC tablet 40 mg  40 mg Oral Daily Pricilla Riffle, MD   40 mg at 04/05/14 1040  . pneumococcal 23 valent vaccine (PNU-IMMUNE) injection 0.5 mL  0.5 mL Intramuscular Tomorrow-1000 Lennette Bihari, MD      . sertraline (ZOLOFT) tablet 25 mg  25 mg Oral Daily Wendall Stade, MD   25 mg at 04/05/14 1041  . ticagrelor (BRILINTA) tablet 90 mg  90 mg Oral BID Lennette Bihari, MD   90 mg at 04/05/14 2100    PE: General appearance: alert, cooperative and no distress Mild dry blood in right nares.  Neck: mild JVD Lungs: clear Heart: regular rate and rhythm, normal S1-2. No gallop or murmur. Extremities: no LEE, pulses 2+ Pulses: 2+ and symmetric Skin: warm and dry Neurologic: Grossly normal  Lab Results:   Recent Labs  04/04/14 0309 04/05/14 0338 04/06/14 0426  WBC 9.5 9.6 9.1  HGB 12.3 11.5* 11.9*  HCT 38.0 34.9* 36.6  PLT 250 225 223   BMET No results  found for this basename: NA, K, CL, CO2, GLUCOSE, BUN, CREATININE, CALCIUM,  in the last 72 hours PT/INR  Recent Labs  04/04/14 0309 04/05/14 0338 04/06/14 0426  LABPROT 13.2 13.7 14.7  INR 1.00 1.05 1.15    Assessment/Plan  Principal Problem:   ST elevation myocardial infarction (STEMI) of anterior wall Active Problems:   Hypertension   LVH (left ventricular hypertrophy)   Palpitations   Anxiety   Hyperlipidemia   ST elevation myocardial infarction (STEMI) involving left anterior descending (LAD) coronary artery with complication  1. STEMI: S/p DES to proximal LAD. Severe systolic dysfunction with EF of 15-20%. She is currently on triple  therapy with ASA + Brilinta for DES and Wafarin for LV thrombus prophylaxis. Now with nosebleed. Will discontinue IV heparin. Little data to  support prophylactic coumadin in this setting especially with DAPT so will stop coumadin. If nosebleed stops will anticipate she will be ready for DC tomorrow. Will arrange for Life Vest post DC.  2. CAD: Continue Brilinta, BB and statin  3. Acute Combined Systolic + Diastolic HF: EF 15-20%. Continue BB. Will initiate low dose ACEi today.  4. HTN: controlled.   5. HLD: LDL elevated at 104. Goal is < 70. Continue high dose Lipitor.   6. Epistaxis- controlled with pressure.   Dispo: SNF- case manager to see today. Needs to discuss plans with daughter. Recommend short term SNF.    LOS: 7 days     Demontez Novack [place], MDFACC 04/06/2014 8:54 AM

## 2014-04-07 ENCOUNTER — Encounter (HOSPITAL_COMMUNITY): Payer: Self-pay | Admitting: Physician Assistant

## 2014-04-07 ENCOUNTER — Inpatient Hospital Stay
Admission: RE | Admit: 2014-04-07 | Discharge: 2014-04-25 | Disposition: A | Discharge status: Home / Self Care | Payer: Medicare Other | Source: Ambulatory Visit | Attending: Internal Medicine | Admitting: Internal Medicine

## 2014-04-07 ENCOUNTER — Telehealth: Payer: Self-pay | Admitting: *Deleted

## 2014-04-07 DIAGNOSIS — J449 Chronic obstructive pulmonary disease, unspecified: Secondary | ICD-10-CM

## 2014-04-07 DIAGNOSIS — I2510 Atherosclerotic heart disease of native coronary artery without angina pectoris: Secondary | ICD-10-CM | POA: Diagnosis present

## 2014-04-07 DIAGNOSIS — I255 Ischemic cardiomyopathy: Secondary | ICD-10-CM | POA: Diagnosis present

## 2014-04-07 LAB — CBC
HEMATOCRIT: 35 % — AB (ref 36.0–46.0)
HEMOGLOBIN: 11.7 g/dL — AB (ref 12.0–15.0)
MCH: 31.9 pg (ref 26.0–34.0)
MCHC: 33.4 g/dL (ref 30.0–36.0)
MCV: 95.4 fL (ref 78.0–100.0)
Platelets: 228 10*3/uL (ref 150–400)
RBC: 3.67 MIL/uL — ABNORMAL LOW (ref 3.87–5.11)
RDW: 14.2 % (ref 11.5–15.5)
WBC: 8.8 10*3/uL (ref 4.0–10.5)

## 2014-04-07 LAB — BASIC METABOLIC PANEL
Anion gap: 13 (ref 5–15)
BUN: 15 mg/dL (ref 6–23)
CALCIUM: 8.9 mg/dL (ref 8.4–10.5)
CO2: 22 mEq/L (ref 19–32)
Chloride: 99 mEq/L (ref 96–112)
Creatinine, Ser: 0.6 mg/dL (ref 0.50–1.10)
GFR calc Af Amer: >90 mL/min (ref >90)
GFR, EST NON AFRICAN AMERICAN: 82 mL/min — AB (ref >90)
GLUCOSE: 103 mg/dL — AB (ref 70–99)
Potassium: 4 mEq/L (ref 3.7–5.3)
Sodium: 134 mEq/L — ABNORMAL LOW (ref 137–147)

## 2014-04-07 MED ORDER — CARVEDILOL 3.125 MG PO TABS: 3.1250 mg | ORAL_TABLET | Freq: Two times a day (BID) | ORAL | Status: DC

## 2014-04-07 MED ORDER — SERTRALINE HCL 25 MG PO TABS: 25.0000 mg | ORAL_TABLET | Freq: Every day | ORAL | Status: DC

## 2014-04-07 MED ORDER — PANTOPRAZOLE SODIUM 40 MG PO TBEC: 40.0000 mg | DELAYED_RELEASE_TABLET | Freq: Every day | ORAL | Status: DC

## 2014-04-07 MED ORDER — ASPIRIN 81 MG PO TBEC
81.0000 mg | DELAYED_RELEASE_TABLET | Freq: Every day | ORAL | Status: DC
Start: 2014-04-07 — End: 2019-03-18

## 2014-04-07 MED ORDER — ATORVASTATIN CALCIUM 80 MG PO TABS: 80.0000 mg | ORAL_TABLET | Freq: Every day | ORAL | Status: DC

## 2014-04-07 MED ORDER — NITROGLYCERIN 0.4 MG SL SUBL: 0.4000 mg | SUBLINGUAL_TABLET | SUBLINGUAL | Status: DC | PRN

## 2014-04-07 MED ORDER — LISINOPRIL 2.5 MG PO TABS: 2.5000 mg | ORAL_TABLET | Freq: Every day | ORAL | Status: DC

## 2014-04-07 MED ORDER — TICAGRELOR 90 MG PO TABS: 90.0000 mg | ORAL_TABLET | Freq: Two times a day (BID) | ORAL | Status: DC

## 2014-04-07 NOTE — Discharge Summary (Signed)
Discharge Summary   Patient ID: [name] MRN: [id number], DOB/AGE: Sep 24, 1927 80 y.o. Admit date: 03/30/2014 D/C date:     04/07/2014  Primary Cardiologist: Dr. Diona Browner   Principal Problem:   ST elevation myocardial infarction (STEMI) of anterior wall Active Problems:   CAD (coronary artery disease)   Hypertension   LVH (left ventricular hypertrophy)   Palpitations   Anxiety   Hyperlipidemia   COPD (chronic obstructive pulmonary disease)   Ischemic cardiomyopathy   Discharge Diagnosis: Acute anterior STEMI s/p DES to LAD  HPI:[name] is a 80 y.o. female with a history of panic attacks, HTN, HLD and COPD who was admitted to [facility] on 03/30/14 for anterior wall STEMI and emergent cardiac catheterization.  She is followed by Dr. Diona Browner in [place] and presented to [facility] ED for a "panic attack" associated with palpitations, SOB and feeling weak all over. A friend recommended she go to ED. She denied any chest pain when she arrived in ED at [facility]. On initial work up her Troponin was 5.2; however, she still was without pain. EKG did show some new Q waves in anterior leads and some upslopping ST changes. Her case with discussed with Dr. Herbie Baltimore at [facility] and it was decided to transfer her for further evaluation. Eventually she did develop lower chest discomfort / epigastric pain and her EKG revealed anterior wall ST elevation. She also had episode of bradycardia with HR in the 30s very brief time. She was transferred emergently to [facility] for code STEMI and emergent cardiac catheterization.   Hospital Course  Anterior STEMI- secondary to total proximal LAD -- S/p PCI + DES to proximal LAD. Also with residual 40-50% diagonal 2 stenosis and 40% LAD stenosis.  -- Extensive wall motion abnormality on cath which, hopefully will have some recovery of function. Medical therapy with carvedilol, ACE/ARB, and dual antiplatelet therapy recommended. She will need to be monitored to make certain  she does not develop LV thrombus due to her akinetic apical segment. There was no evidence of thrombus with use of Definity contrast on 2D echo. -- She was on triple therapy with ASA + Brilinta for DES and Wafarin for LV thrombus prophylaxis. Developed epistaxis and Coumadin/heparin stopped. Epistaxis resolved. Little data to support prophylactic coumadin in this setting especially with DAPT so will stop coumadin. Has Life Vest and instructed on its use.   Ischemic cardiomyopathy- previously normal EF 65-70% on ECHO 01/2014. -- 04/02/14 ECHO with severe LV dysfunction with an EF of 15-20%. There is akinesis of the anteroseptal and apical myocardium and akinesis of the mid-apicalinferolateral and inferior myocardium. Grade 1 diastolic dysfunction. Mild LA dilation.  -- On ACE and BB. Life vest placed. She will need re-evaluation of LV function in 90 days as an outpatient to assess for ICD placement.  Acute Combined Systolic + Diastolic HF: EF 15-20%.  -- She was placed on IV Lasix for some of her admission and diuresed 1.2 L. -- BNP significantly improved after IV diuresis: 10.9K--> 3.2K. BP was running on the soft side so Lasix was discontinued  -- Appears evolemic currently- no diuretics at discharge.   HTN: soft blood pressures. Continue to monitor.   HLD: LDL elevated at 104. Goal is < 70. Continue high dose Lipitor.   Epistaxis- controlled with pressure and cessation of anticoagulation.   Loose stools. Stool this am had some old heme in it. This started with nose bleed and suspected to be related to epistaxis. Hgb  stable. Hg 11.7. Continue to observe.  Chronic anxiety- Zoloft started   Dispo- discharge to SNF at [facility]. Pneumovax was placed prior to DC.   The patient has had an uncomplicated hospital course and is recovering well. The femoral catheter site is stable. She has been seen by Dr. Swaziland today and deemed ready for discharge to SNF at [facility]. All  follow-up appointments have been scheduled. A 30 day free supply of Brilinta was provided for the patient.  Discharge medications include lisinopril 2.5mg , coreg 3.125mg  BID, ASA/ Brilinta, SL NTG and Lipitor 80mg . She was also discharged on Zoloft 25mg  qd and protonix 40mg  qd.    Discharge Vitals: Blood pressure 91/52, pulse 76, temperature 98.1 F (36.7 C), temperature source Oral, resp. rate 18, height 5' (1.524 m), weight 107 lb 12.9 oz (48.9 kg), SpO2 96.00%.  Labs: Lab Results  Component Value Date   WBC 8.8 04/07/2014   HGB 11.7* 04/07/2014   HCT 35.0* 04/07/2014   MCV 95.4 04/07/2014   PLT 228 04/07/2014     Recent Labs Lab 04/07/14 0353  NA 134*  K 4.0  CL 99  CO2 22  BUN 15  CREATININE 0.60  CALCIUM 8.9  GLUCOSE 103*    Lab Results  Component Value Date   CHOL 193 03/31/2014   HDL 69 03/31/2014   LDLCALC 104* 03/31/2014   TRIG 99 03/31/2014     Diagnostic Studies/Procedures   Portable Chest X-ray 1 View  03/31/2014   CLINICAL DATA:  Shortness of breath, CHF  EXAM: PORTABLE CHEST - 1 VIEW  COMPARISON:  Portable chest x-ray of March 30, 2014  FINDINGS: The lungs are well-expanded. The interstitial markings are less conspicuous today. The retrocardiac region on the left remains dense and partial obscuration of the left hemidiaphragm persists. The cardiac silhouette is mildly enlarged. The pulmonary vascularity remains engorged but is more distinct. The bones are osteopenic.  IMPRESSION: Improving CHF superimposed upon COPD. Left lower lobe atelectasis or infiltrate persists but is slightly less conspicuous.    Dg Chest Port 1 View  03/30/2014   CLINICAL DATA:  Myocardial infarction  EXAM: PORTABLE CHEST - 1 VIEW  COMPARISON:  May 10, 2011  FINDINGS: There is underlying emphysematous change. There is mild diffuse interstitial edema. There is patchy airspace consolidation in the left base. Heart is enlarged with pulmonary venous hypertension. No adenopathy. There is  atherosclerotic change in the aorta. Bones are osteoporotic.  IMPRESSION: Evidence of congestive heart failure superimposed on emphysematous change. Consolidation behind the left heart is probably indicative of underlying pneumonia, although this finding could be due to alveolar edema. There is extensive calcification in the aortic arch region.    EMERGENT CARDIAC CATHETERIZATION/PERCUTANEOUS CORONARY INTERVENTION  HISTORY:  RONALD VINSANT is a 80 y.o. female who has a history of hypertension, and developed episodes of increasing shortness of breath yesterday. She ultimately presented to [facility] today. ECG showed QS complex anteriorly. Troponin was positive. She developed recurrent chest pain. She was transferred for ST segment elevation anterior wall myocardial infarction is taken acutely to the cardiac catheterization laboratory.  PROCEDURE: Left heart catheterization: Coronary angiography, left ventriculography; emergent percutaneous coronary intervention with PTCA/stenting of a totally occluded proximal LAD  The patient was brought to the cath lab by EMS in transfer from [facility]. She was premedicated with Ativan prior to transfer and upon arrival was sedated. The right femoral artery was punctured anteriorly and a 5 [place] external sheath  was inserted without difficulty. Diagnostic catheterization was done utilizing 5 [place] JL 3.5 and JR 4 diagnostic catheters. The sheath was upgraded to a 6 [place] sheath. A 6 French XB LAD 3.5 guide was used for the procedure. Angiomax bolus plus infusion was administered. An Asahi medium wire was able to cross the total proximal occlusion. Initial PTCA dilatation was done with a emergent 2.0x12 mm balloon at 6 and 10 atmospheres. The demonstration of stenosis beyond the initial occlusion a resolute integrity 3.0x26 mm DES stent was inserted to cover the entire region. This was dilated at 12 and 14 atmospheres. Post stent dilatation was done with an  [place] 3.25x20 mm balloon 3 inflations up to 14 atmospheres. Angiography confirmed an excellent angiographic result. Left ventriculography was done with 25 cc of contrast at 12 cc per second. The arterial sheath was sutured in place. Angiomax will be be continued for 4 hours post procedure. The patient left the catheterization laboratory, chest pain-free.  HEMODYNAMICS:  Central Aorta: Initial AO pressure was 104/70  On LV pullback:  Left Ventricle: 83/20 and AO pressure 83/50  ANGIOGRAPHY:  Left main: Normal vessel, which bifurcated into the LAD and left circumflex vessel.  LAD: Totally occluded just beyond the ostium with TIMI 0 flow  Left circumflex: Angiographically normal vessel, which gave rise to one major marginal branch.  Right coronary artery: Large, dominant RCA, which supplied a large PDA and PLA vessel  PCI: Following initial dilatation of the LAD beyond the ostium. There was diffuse narrowing of 50% in the region of the first diagonal vessel. The LAD was large caliber and extended and wrapped around the LV apex and gave rise to additional second diagonal vessel, which had 40-50% mid narrowing, and there was 40%, mild narrowing in the mid LAD beyond the second diagonal vessel. Following PCI with PTCA/stenting with insertion of a 3.0x26 mm Resolute Integrity DES stent artery. The ostial and proximal lesions, entire region was reduced to 0%. There was brisk TIMI-3 flow. There is no evidence for dissection.  Left ventriculography revealed severe acute LV dysfunction with an ejection fraction of 15-20%. There was severe hypo-akinesis involving the anterolateral wall, akinesis of the apex, and severe hypo-to kinesis involving the inferior wall, extending to the mid segment. The posterior basal segment contracted vigorously.  IMPRESSION:  Acute ST segment elevation anterior wall myocardial infarction secondary to total proximal LAD occlusion with initial TIMI 0 flow.  Normal left circumflex  and dominant right coronary arteries.  Successful emergent PCI to the total proximal LAD with ultimate insertion of a 3.0x26 mm Resolute Integrity DES stent with the stenosis being reduced to 0% and showed TIMI 3 flow. There is also evidence for 40%-50% diagonal 2 stenosis and 40% LAD stenosis.  RECOMMENDATION:  On presentation, the patient's ECG suggests LAD occlusion, which may have begun since yesterday in light of her symptoms of increasing shortness of breath. She does have extensive wall motion abnormality , which, hopefully will have some recovery of function. Medical therapy will be recommended for this large myocardial infarction including carvedilol, ACE or ARB therapy, and dual antiplatelet therapy. She will need to be monitored to make certain she does not develop LV thrombus due to her akinetic apical segment  2D ECHO 04/02/2014 LV EF: 15% - 20% Study Conclusions - Left ventricle: The cavity size was normal. Systolic function was severely reduced. The estimated ejection fraction was in the range of 15% to 20%. There is akinesis of the anteroseptal and apical myocardium.  There is akinesis of the mid-apicalinferolateral and inferior myocardium. Doppler parameters are consistent with abnormal left ventricular relaxation (grade 1 diastolic dysfunction). No evidence of thrombus with use of Definity contrast. - Mitral valve: Calcified annulus. - Left atrium: The atrium was mildly dilated.    2D ECHO: 03/31/2014 LV EF: 10% - 15% Study Conclusions - Left ventricle: Wall thickness was increased in a pattern of mild LVH. There was focal basal hypertrophy. Systolic function was severely reduced. The estimated ejection fraction was in the range of 10% to 15%. Dyskinesis of the mid-apicalanteroseptal, inferolateral, and apical myocardium; consistent with infarction. There was an increased relative contribution of atrial contraction to ventricular filling. Doppler parameters  are consistent with abnormal left ventricular relaxation (grade 1 diastolic dysfunction). - Left atrium: The atrium was severely dilated. - Pulmonary arteries: Systolic pressure was mildly increased.    Discharge Medications     Medication List    STOP taking these medications       aspirin 325 MG tablet  Replaced by:  aspirin 81 MG EC tablet     atenolol 25 MG tablet  Commonly known as:  TENORMIN     ibuprofen 200 MG tablet  Commonly known as:  ADVIL,MOTRIN     ST JOHNS WORT PO      TAKE these medications       aspirin 81 MG EC tablet  Take 1 tablet (81 mg total) by mouth daily.     atorvastatin 80 MG tablet  Commonly known as:  LIPITOR  Take 1 tablet (80 mg total) by mouth daily at 6 PM.     carvedilol 3.125 MG tablet  Commonly known as:  COREG  Take 1 tablet (3.125 mg total) by mouth 2 (two) times daily with a meal.     lisinopril 2.5 MG tablet  Commonly known as:  PRINIVIL,ZESTRIL  Take 1 tablet (2.5 mg total) by mouth daily.     multivitamin with minerals Tabs tablet  Take 1 tablet by mouth daily.     nitroGLYCERIN 0.4 MG SL tablet  Commonly known as:  NITROSTAT  Place 1 tablet (0.4 mg total) under the tongue every 5 (five) minutes x 3 doses as needed for chest pain.     pantoprazole 40 MG tablet  Commonly known as:  PROTONIX  Take 1 tablet (40 mg total) by mouth daily.     sertraline 25 MG tablet  Commonly known as:  ZOLOFT  Take 1 tablet (25 mg total) by mouth daily.     ticagrelor 90 MG Tabs tablet  Commonly known as:  BRILINTA  Take 1 tablet (90 mg total) by mouth 2 (two) times daily.        Disposition   The patient will be discharged in stable condition to home. Discharge Instructions   Amb Referral to Cardiac Rehabilitation    Complete by:  As directed           Follow-up Information   [facility] On 04/14/2014. Herma Carson(Michelle Lenze PA-C @ 11:20am )    Specialty:  Cardiology   Contact information:   [address] [place] [address] [phone]        Duration of Discharge Encounter: Greater than 30 minutes including physician and PA time.  Johnny BridgeSigned, STERN, Saifan Rayford PA-C 04/07/2014, 12:23 PM

## 2014-04-07 NOTE — Plan of Care (Signed)
Problem: Discharge Progression Outcomes Goal: Complications resolved/controlled Outcome: Completed/Met Date Met:  04/07/14 Heparin and coumadn discontinued after nosebleed.

## 2014-04-07 NOTE — Telephone Encounter (Signed)
Patient contacted [facility] on 04-07-14.  Patient understands to follow up with provider Jacolyn ReedyMichele Lenze, PA on 04/14/14 at 11:20 am [facility]. Patient understands discharge instructions? yes Patient understands medications and regiment? yes Patient understands to bring all medications to this visit? yes

## 2014-04-07 NOTE — Progress Notes (Signed)
Patient discontinued from telemetry, IV removal tolerated well with no s/s of infection.  Patient and RN with daughter present reviewed AVS.  Patient and daughter stated understanding.  RN also explained to patient the s/s of infection for her groin site.  Patient stated understanding that should redness, drainage, a fever, etc were to occur to please contact her PCP.  Patient discharge into daughter's care via wheelchair to [facility]. Report called and given to Lost NationOlivia, Charity fundraiserN.

## 2014-04-07 NOTE — Telephone Encounter (Signed)
t

## 2014-04-07 NOTE — Progress Notes (Addendum)
Patient Profile: 80 year old female with hx of HTN, HLD and COPD admitted 03/30/14 for anterior wall STEMI, secondary to total proximal LAD occlusion. S/p PCI + DES to proximal LAD. Also with residual 40-50% diagonal 2 stenosis and 40% LAD stenosis. Severe LV dysfunction with an EF of 15-20%. There is akinesis of the anteroseptal and apical myocardium. There is akinesis of the mid-apicalinferolateral and inferior myocardium. Grade 1 diastolic dysfunction. No evidence of thrombus with use of Definity contrast on 2D echo.   Subjective: Denies CP and SOB. No further nosebleed. Complains of loose stools since yesterday. No abdominal pain or fever.  Objective: Vital signs in last 24 hours: Temp:  [98.4 F (36.9 C)-99.7 F (37.6 C)] 98.4 F (36.9 C) (07/22 0404) Pulse Rate:  [80-96] 80 (07/22 0404) Resp:  [17-18] 18 (07/22 0404) BP: (94-133)/(49-74) 94/49 mmHg (07/22 0404) SpO2:  [93 %-96 %] 94 % (07/22 0404) Last BM Date: 04/06/14  Intake/Output from previous day: 07/21 0701 - 07/22 0700 In: 480 [P.O.:480] Out: -  Intake/Output this shift:    Medications Current Facility-Administered Medications  Medication Dose Route Frequency Provider Last Rate Last Dose  . 0.9 %  sodium chloride infusion   Intravenous Continuous Nada Boozer, NP 10 mL/hr at 04/05/14 1553    . acetaminophen (TYLENOL) tablet 650 mg  650 mg Oral Q4H PRN Nada Boozer, NP      . aspirin EC tablet 81 mg  81 mg Oral Daily Lennette Bihari, MD   81 mg at 04/06/14 1058  . atorvastatin (LIPITOR) tablet 80 mg  80 mg Oral q1800 Lennette Bihari, MD   80 mg at 04/06/14 1756  . carvedilol (COREG) tablet 3.125 mg  3.125 mg Oral BID WC Quintella Reichert, MD   3.125 mg at 04/06/14 1756  . lisinopril (PRINIVIL,ZESTRIL) tablet 2.5 mg  2.5 mg Oral Daily Sharada Albornoz M Swaziland, MD   2.5 mg at 04/06/14 1058  . LORazepam (ATIVAN) tablet 0.5 mg  0.5 mg Oral Q6H PRN Wendall Stade, MD   0.5 mg at 04/03/14 2346  . multivitamin with minerals tablet 1  tablet  1 tablet Oral Daily Nada Boozer, NP   1 tablet at 04/06/14 1058  . nitroGLYCERIN (NITROSTAT) SL tablet 0.4 mg  0.4 mg Sublingual Q5 Min x 3 PRN Nada Boozer, NP      . ondansetron [facility]) injection 4 mg  4 mg Intravenous Q6H PRN Nada Boozer, NP   4 mg at 04/03/14 2130  . pantoprazole (PROTONIX) EC tablet 40 mg  40 mg Oral Daily Pricilla Riffle, MD   40 mg at 04/06/14 1058  . pneumococcal 23 valent vaccine (PNU-IMMUNE) injection 0.5 mL  0.5 mL Intramuscular Tomorrow-1000 Lennette Bihari, MD      . sertraline (ZOLOFT) tablet 25 mg  25 mg Oral Daily Wendall Stade, MD   25 mg at 04/06/14 1058  . ticagrelor (BRILINTA) tablet 90 mg  90 mg Oral BID Lennette Bihari, MD   90 mg at 04/06/14 2252    PE: General appearance: alert, cooperative and no distress No blood in nares. Neck: mild JVD Lungs: clear Heart: regular rate and rhythm, normal S1-2. No gallop or murmur. Abd: soft, NT, BS + Extremities: no LEE, pulses 2+ Pulses: 2+ and symmetric Skin: warm and dry Neurologic: Grossly normal  Lab Results:   Recent Labs  04/05/14 0338 04/06/14 0426 04/07/14 0353  WBC 9.6 9.1 8.8  HGB 11.5* 11.9* 11.7*  HCT 34.9* 36.6  35.0*  PLT 225 223 228   BMET  Recent Labs  04/07/14 0353  NA 134*  K 4.0  CL 99  CO2 22  GLUCOSE 103*  BUN 15  CREATININE 0.60  CALCIUM 8.9   PT/INR  Recent Labs  04/05/14 0338 04/06/14 0426  LABPROT 13.7 14.7  INR 1.05 1.15    Assessment/Plan  Principal Problem:   ST elevation myocardial infarction (STEMI) of anterior wall Active Problems:   Hypertension   LVH (left ventricular hypertrophy)   Palpitations   Anxiety   Hyperlipidemia   ST elevation myocardial infarction (STEMI) involving left anterior descending (LAD) coronary artery with complication  1. STEMI: S/p DES to proximal LAD. Severe systolic dysfunction with EF of 15-20%. She was  on triple  therapy with ASA + Brilinta for DES and Wafarin for LV thrombus prophylaxis. Developed  epistaxis and Coumadin/heparin stopped. Epistaxis resolved.  Little data to support prophylactic coumadin in this setting especially with DAPT so will stop coumadin. Has Life Vest and instructed on its use.   2. CAD: Continue Brilinta, BB and statin  3. Acute Combined Systolic + Diastolic HF: EF 15-20%. Continue BB and ACEi. Does not appear volume overloaded.   4. HTN: controlled.   5. HLD: LDL elevated at 104. Goal is < 70. Continue high dose Lipitor.   6. Epistaxis- controlled with pressure and cessation of anticoagulation.   7. Loose stools. Stool this am had some old heme in it. This started with nose bleed and I suspect is related to epistaxis. Hgb stable. Will observe for now.    Dispo: Plan for DC to SNF today if bed available. FL-2 signed on chart. Family [facility]. Needs Pneumovax prior to DC. Home with [facility]. Follow up [place] Dr. Diona Brow[place]McDowell [place] in 2 weeks.   LOS: 8 days     [name] Swazil[place]Jordan, MDFACC 04/07/2014 7:39 AM

## 2014-04-07 NOTE — Progress Notes (Signed)
[id number] Saw pt up walking with daughter and rolling walker. Pt tolerated well. Gave modified ex ed for walking per written guidelines. Discussed CRP 2 and pt gave permission to refer to [place]. Luetta NuttingCharlene Eilene Voigt RN BSN 04/07/2014 10:43 AM

## 2014-04-07 NOTE — Progress Notes (Signed)
Clinical Social Worker facilitated patient discharge including contacting patient,patient's daughter by bedside and facility to confirm patient discharge plans.  Clinical information faxed to facility and patient agreeable with plan.  Patient's daughter states she would [facility]. RN to call report prior to discharge.  Clinical Social Worker will sign off for now as social work intervention is no longer needed. Please consult us again if new need arises.  Maree KrabbeLindsay Izyk Marty, MSW, Theresia MajorsLCSWA [phone]

## 2014-04-07 NOTE — Discharge Summary (Signed)
Patient seen and examined and history reviewed. Agree with above findings and plan. See earlier rounding note   Tandy Grawe Swazil[place]Jordan, MDFACC 04/07/2014 2:00 PM

## 2014-04-07 NOTE — Progress Notes (Signed)
CSW spoke to patient and patient's daughter by bedside. Daughter states [facility] would [facility]. [facility] to SNF. CSW awaiting dc order from MD.  Maree KrabbeLindsay Shanese Riemenschneider, MSW, Theresia MajorsLCSWA [phone]

## 2014-04-07 NOTE — Telephone Encounter (Signed)
7 DAY TCM EPH W/ MICHELLE KENZE

## 2014-04-07 NOTE — Progress Notes (Signed)
Clinical Social Work Department CLINICAL SOCIAL WORK PLACEMENT NOTE 04/07/2014  Patient:  [name],[name]  Account Number:  [id number] Admit date:  03/30/2014  Clinical Social Worker:  Carren RangLINDSAY Betzalel Umbarger, LCSWA  Date/time:  04/06/2014 11:01 AM  Clinical Social Work is seeking post-discharge placement for this patient at the following level of care:   SKILLED NURSING   (*CSW will update this form in Epic as items are completed)   04/06/2014  Patient/family provided with [facility] Department of Clinical Social Work's list of facilities offering this level of care within the geographic area requested by the patient (or if unable, by the patient's family).  04/06/2014  Patient/family informed of their freedom to choose among providers that offer the needed level of care, that participate in Medicare, Medicaid or managed care program needed by the patient, have an available bed and are willing to accept the patient.  04/06/2014  Patient/family informed of [facility]' ownership interest in [facility], as well as of the fact that they are under no obligation to receive care at this facility.  PASARR submitted to EDS on 04/06/2014 PASARR number received on 04/06/2014  FL2 transmitted to all facilities in geographic area requested by pt/family on  04/06/2014 FL2 transmitted to all facilities within larger geographic area on   Patient informed that his/her managed care company has contracts with or will negotiate with  certain facilities, including the following:     Patient/family informed of bed offers received:  04/06/2014 [facility] Physician recommends and patient chooses bed at    [facility] on  04/07/2014 Patient to be transferred to facility by DAUGHTER Patient and family notified of transfer on 04/07/2014 Name of family member notified:  DAUGHTER  The following physician request were entered in  Epic:   Additional Comments:  Maree KrabbeLindsay Nafisa Olds, MSW, [employer] [phone]

## 2014-04-09 ENCOUNTER — Non-Acute Institutional Stay (SKILLED_NURSING_FACILITY): Payer: Medicare Other | Admitting: Internal Medicine

## 2014-04-09 DIAGNOSIS — D72829 Elevated white blood cell count, unspecified: Secondary | ICD-10-CM

## 2014-04-09 DIAGNOSIS — I2589 Other forms of chronic ischemic heart disease: Secondary | ICD-10-CM

## 2014-04-09 DIAGNOSIS — I255 Ischemic cardiomyopathy: Secondary | ICD-10-CM

## 2014-04-09 DIAGNOSIS — I10 Essential (primary) hypertension: Secondary | ICD-10-CM

## 2014-04-09 DIAGNOSIS — I2109 ST elevation (STEMI) myocardial infarction involving other coronary artery of anterior wall: Secondary | ICD-10-CM

## 2014-04-09 DIAGNOSIS — R195 Other fecal abnormalities: Secondary | ICD-10-CM

## 2014-04-09 NOTE — Progress Notes (Signed)
Patient ID: [name], female   DOB: 01/10/1928, 80 y.o.   MRN: [id number]   This is an acute visit.  Level of care skilled.  [facility]  Chief complaint-acute visit status post hospitalization for STEMI--ischemic cardiomyopathy-CHF.  History of present illness.  Patient is a very pleasant 80 year old female with a history of hyperlipidemia hypertension COPD who is admitted to [facility] on July 14 for an anterior wall STEMI and an emergent cardiac catheterization.  She initially presented to the emergency department with a panic attack with palpitations and shortness as well as weakness.  Denied any chest pain.  Her troponin however was elevated at 5.2.  EKG showed some new Q waves in the anterior leads and some upsloping ST changes-she was transferred from any pain to [facility] for further evaluation..  She did eventually develop chest discomfort EKG did reveal an anterior wall ST elevation.  Also had bradycardia with a heart rate in the 30s for short time.  She was transferred emergently for an STEMI a in emergent cardiac catheterization  She is now status post PCI and DES to proximal left anterior descending artery also with residual 4050% diagonal 2 stenosis and 40% LAD stenosis  She had an extensive wall motion abnormality there is some hope she will have some recovery of function-she is now on medical therapy with Coreg and ACE inhibitor and dual antiplatelet therapy.  She isnon Brilanta as well as aspirin---she initially was treated with Coumadin for left ventricular thrombus prophylaxis however she developed epitaxis and Coumadin was stopped and epitaxis resolved  Apparently she did have some blood in her stool this was thought secondary to the nosebleed.  Her hemoglobin remained stable.  In regards to ischemic cardiomyopathy her normal ejection fraction previously was 65-70% on an echo done in May-subsequent echo on July 17 showed severe left ventricular  dysfunction with an ejection fraction 15-20%-she does have a life vest placed with suggestion for reevaluation of her left ventricular function in 90 days to assess for ICD placement.  She also was placed on IV Lasix and diureses her BMP significantly improved however her blood pressure was running somewhat low so Lasix was discontinued-she has no diuretics currently.  Patient also apparently has some loose stools before discharge this did again have some blood in it this was thought secondary to nosebleed her hemoglobin was stable.  Currently she appears to be doing very well actually ambulating about the facility does not complaining shortness of breath or chest pain.  Apparently she does have some loose stools yet-we did do lab work which shows a stable hemoglobin of 11.8-however her white count is mildly elevated at 13.6 and appeared to run 8-9 in the hospital.  She is afebrile denies any fever chills or dysuria or shortness of breath chest congestion or cough.  Family medical social history as been reviewed per discharge note on 04/08/2014.  Medications have been reviewed per MAR.  She is on aspirin 81 mg daily.  Lipitor 80 mg daily.  Heart rate 3.125 mg twice a day.  Lisinopril 2.5 mg daily.  Multivitamin daily.  Nitroglycerin 0.4 mg when necessary.  Protonix 40 mg daily.  Zoloft 25 mg daily.  Brilanta 90 mg twice a day.   review of systems.  In general she has no complaints of fever chills says she feels pretty good.  Skin does not complaining of any itching or rash.  Eyes denies any visual changes.  Ear nose mouth and throat says  her throat feels a bit scratchy otherwise denies any nasal discharge or pain.  Respiratory-denies any shortness of breath or cough.  Cardiac denies any chest pain does not have really any significant lower extremity edema does not complaining of palpitations.  GI does not complaining of any abdominal pain does state she has had some  loose stools apparently this started in the hospital-no nausea or vomiting noted.  GU does not complaining of any dysuria or burning.  Muscle skeletal is not complaining of any joint pain again she is up ambulating appears to be doing well with this.  Neurologic does not complaining of any dizziness or headache or syncopal-type feelings.  Psych apparently some history of anxiety and depression but this appears stable currently.  Physical exam.  Temperature is 98.2 pulse 84 respirations 18 blood pressure 102/58-120/63-weight is 106.8 this appears to loss of possibly a couple pounds since admission although we have minimal data so far.  In general this is a very pleasant elderly female in no distress.  Her skin is warm and dry do not what appears to be a protrusion it appears to be a lipoma of her right elbow this is flesh-colored cool nontender nonerythematous she says this is chronic.  Eyes pupils appear reactive to light sclera and conjunctiva are clear extraocular movements intact visual acuity appears grossly intact.  Oropharynx is clear mucous membranes moist.--Tongue is midline  Chest is clear to auscultation there is no labored breathing.  Heart is regular rate and rhythm without murmur gallop or rub she does not have significant lower extremity edema-she does have a life vest in place.  Abdomen is protuberant she says this is chronic it is soft nontender with active bowel sounds  GU-could not really appreciate any suprapubic tenderness.  Rectal-I did not appreciate any external hemorrhoids digital exam appeared to be positive for occult blood although sample was quite minimal.  Muscle skeletal moves all extremities x4 is ambulatory appears to be doing quite well with this.  Neurologic is grossly intact her speech is clear I do not see any lateralizing findings.  Psych she is alert and oriented x3 very pleasant and engaging.  Labs.  04/08/2014.  WBC 13.6 hemoglobin  11.8 platelets 251-actually granulocytes elevated at 11.4.  Sodium 137 potassium 5 BUN 20 creatinine 0.66.  03/31/2014.  Liver function tests within normal limits except AST of 70 albumin of 3.3  Cholesterol was 193 HDL 69 LDL 104 triglycerides 99.  Assessment plan.  #1-history of STEMI-this was thought secondary to total proximal LAD-with extensive wall motion abnormality-she continues on low-dose ACE inhibitor as well as Coreg and dual antiplatelet therapy with aspirin and Brilanta--she appears to be doing quite well clinically currently no complaints of chest pain ambulating about the facility already.  At this point continue to monitor.  #2 history of ischemic cardiomyopathy with ejection fraction 15-20%-she's not on a diuretic secondary to low blood pressures-her weights will have to be monitored carefully and notify provider gain greater than 3 pounds-she does not really have any significant edema currently it appears she has not gained any weight here since her admission yesterday this will have to be monitored obviously closely.  #3 hypertension-again she is on low-dose lisinopril and Coreg blood pressures 102/58-120/63 recently this appears relatively stable she is asymptomatic  Of  hypotension continue to monitor.  #4-history hyperlipidemia LDL was elevated at 100 for the hospital she is on high-dose Lipitor-liver function tests were fairly unremarkable except for an AST mildly elevated at  70.  #5 leukocytosis-somewhat unknown etiology however she apparently still having some diarrhea we'll order a C. difficile-also will order a UA CNS-she does not complaining of any respiratory symptoms-I mentioned possibility o a chest xray-- she would prefer to  defer that since she has no symptoms currently  Also will order a CBC with differential and metabolic panel tomorrow for followup  I she appears  stable  at this point.  #6-history of heme-positive stool--- occult testing digitally n  appeared to be positive however minimal sample this may be residual from the nosebleed-her hemoglobin continues to be stable again we will update her hemoglobin level  tomorrow and continue to test her stools for blood.  #7 history of chronic anxiety-depression?-Apparently Zoloft was started in the hospital-she appears stable in this regards today   #8-history of scratchy throat-will order when necessary Chloraseptic  spray and monitor.  Of note she is on Protonix for GI protection.  [id number] note greater than 40 minutes spent assessing patient-reviewing her medical records-and coordinating and formulating a plan of care for numerous diagnoses-of note greater than 50% of time spent coordinating plan of care

## 2014-04-10 ENCOUNTER — Encounter: Payer: Self-pay | Admitting: Internal Medicine

## 2014-04-10 DIAGNOSIS — D72829 Elevated white blood cell count, unspecified: Secondary | ICD-10-CM | POA: Insufficient documentation

## 2014-04-10 DIAGNOSIS — R195 Other fecal abnormalities: Secondary | ICD-10-CM | POA: Insufficient documentation

## 2014-04-12 ENCOUNTER — Non-Acute Institutional Stay (SKILLED_NURSING_FACILITY): Payer: Medicare Other | Admitting: Internal Medicine

## 2014-04-12 DIAGNOSIS — I2589 Other forms of chronic ischemic heart disease: Secondary | ICD-10-CM

## 2014-04-12 DIAGNOSIS — I255 Ischemic cardiomyopathy: Secondary | ICD-10-CM

## 2014-04-12 DIAGNOSIS — I2109 ST elevation (STEMI) myocardial infarction involving other coronary artery of anterior wall: Secondary | ICD-10-CM

## 2014-04-12 DIAGNOSIS — R195 Other fecal abnormalities: Secondary | ICD-10-CM

## 2014-04-12 DIAGNOSIS — I43 Cardiomyopathy in diseases classified elsewhere: Secondary | ICD-10-CM

## 2014-04-12 DIAGNOSIS — I119 Hypertensive heart disease without heart failure: Secondary | ICD-10-CM
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
## 2014-04-14 ENCOUNTER — Ambulatory Visit (INDEPENDENT_AMBULATORY_CARE_PROVIDER_SITE_OTHER): Payer: Medicare Other | Admitting: Physician Assistant

## 2014-04-14 ENCOUNTER — Encounter: Payer: Self-pay | Admitting: Physician Assistant

## 2014-04-14 VITALS — BP 118/66 | HR 86 | Ht 60.0 in | Wt 107.0 lb

## 2014-04-14 DIAGNOSIS — I209 Angina pectoris, unspecified: Secondary | ICD-10-CM

## 2014-04-14 DIAGNOSIS — I2589 Other forms of chronic ischemic heart disease: Secondary | ICD-10-CM

## 2014-04-14 DIAGNOSIS — I517 Cardiomegaly: Secondary | ICD-10-CM

## 2014-04-14 DIAGNOSIS — I255 Ischemic cardiomyopathy: Secondary | ICD-10-CM

## 2014-04-14 DIAGNOSIS — I10 Essential (primary) hypertension: Secondary | ICD-10-CM

## 2014-04-14 DIAGNOSIS — I2510 Atherosclerotic heart disease of native coronary artery without angina pectoris: Secondary | ICD-10-CM

## 2014-04-14 DIAGNOSIS — I25119 Atherosclerotic heart disease of native coronary artery with unspecified angina pectoris: Secondary | ICD-10-CM

## 2014-04-14 MED ORDER — CARVEDILOL 6.25 MG PO TABS: 6.2500 mg | ORAL_TABLET | Freq: Two times a day (BID) | ORAL | Status: DC

## 2014-04-14 NOTE — Assessment & Plan Note (Signed)
She is wearing a life vest. We'll check a 2-D echo in 3 months in hopes of LV recovery. Will increase carvedilol to 6.25 mg twice a day. Followup with Dr. Diona BrownerMcDowell in 2 months.

## 2014-04-14 NOTE — Addendum Note (Signed)
Addended by: Tenna DelaineBARKER, [place] T on: 04/14/2014 02:01 PM   Modules accepted: Orders

## 2014-04-14 NOTE — Assessment & Plan Note (Signed)
Blood pressure stable. Increase carvedilol.
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
## 2014-04-14 NOTE — Patient Instructions (Signed)
Your physician recommends that you schedule a follow-up appointment in: 2 months with Dr Diona BrownerMcDowell  Your physician recommends that you return for lab work in 6 weeks. LFTs/Lipids  Your physician has requested that you have an echocardiogram. Echocardiography is a painless test that uses sound waves to create images of your heart. It provides your doctor with information about the size and shape of your heart and how well your heart's chambers and valves are working. This procedure takes approximately one hour. There are no restrictions for this procedure. In 3 months  Your physician has recommended you make the following change in your medication:   Increased Coreg 6.25 mg twice a day.

## 2014-04-14 NOTE — Assessment & Plan Note (Addendum)
Patient suffered a large MI and has an EF of 15-20%. She was treated with a drug-eluting stent to the LAD and is currently on Brilinta and aspirin. She is doing quite well without chest pain. She is wearing a life vest. We'll check a 2-D echo in 3 months in hopes of LV recovery. Will increase carvedilol to 6.25 mg twice a day. Followup with Dr. Diona BrownerMcDowell in 2 months. [facility]. We'll try to obtain these records. 2 g sodium diet.

## 2014-04-23 ENCOUNTER — Encounter: Payer: Self-pay | Admitting: Cardiovascular Disease

## 2014-04-24 ENCOUNTER — Non-Acute Institutional Stay (SKILLED_NURSING_FACILITY): Payer: Medicare Other | Admitting: Internal Medicine

## 2014-04-24 ENCOUNTER — Encounter: Payer: Self-pay | Admitting: Internal Medicine

## 2014-04-24 DIAGNOSIS — I2510 Atherosclerotic heart disease of native coronary artery without angina pectoris: Secondary | ICD-10-CM

## 2014-04-24 DIAGNOSIS — I10 Essential (primary) hypertension: Secondary | ICD-10-CM

## 2014-04-24 DIAGNOSIS — I255 Ischemic cardiomyopathy: Secondary | ICD-10-CM

## 2014-04-24 DIAGNOSIS — I2584 Coronary atherosclerosis due to calcified coronary lesion: Secondary | ICD-10-CM

## 2014-04-24 DIAGNOSIS — D72829 Elevated white blood cell count, unspecified: Secondary | ICD-10-CM

## 2014-04-24 DIAGNOSIS — I2589 Other forms of chronic ischemic heart disease: Secondary | ICD-10-CM

## 2014-04-24 NOTE — Progress Notes (Signed)
Patient ID: [name], female   DOB: March 16, 1928, 80 y.o.   MRN: [id number]   This is an acute visit.  Level of care skilled.  Facility [facility] .  Chief complaint-discharge note .  History of present illness.  Patient is a very pleasant 80 year old female with a history of hyperlipidemia hypertension COPD who is admitted to [facility] on July 14 for an anterior wall STEMI and an emergent cardiac catheterization.  She initially presented to the emergency department with a panic attack with palpitations and shortness as well as weakness.  Denied any chest pain.  Her troponin however was elevated at 5.2.   .  She did eventually develop chest discomfort EKG did reveal an anterior wall ST elevation.  Also had bradycardia with a heart rate in the 30s for short time.  She was transferred emergently for an STEMI a in emergent cardiac catheterization  She is now status post PCI and DES to proximal left anterior descending artery also with residual 4050% diagonal 2 stenosis and 40% LAD stenosis--she had a stent placed  She had an extensive wall motion abnormality there is some hope she will have some recovery of function-she is now on medical therapy with Coreg and ACE inhibitor and dual antiplatelet therapy.  She isnon Brilanta as well as aspirin---she initially was treated with Coumadin for left ventricular thrombus prophylaxis however she developed epitaxis and Coumadin was stopped and epitaxis resolved  Apparently she did have some blood in her stool this was thought secondary to the nosebleed.  Her hemoglobin appears to be relatively stable.  In regards to ischemic cardiomyopathy her normal ejection fraction previously was 65-70% on an echo done in May-subsequent echo on July 17 showed severe left ventricular dysfunction with an ejection fraction 15-20%-she does have a life vest placed with suggestion for reevaluation of her left ventricular function in 90 days to assess for ICD placement.  She  also was placed on IV Lasix and diureses her BMP significantly improved however her blood pressure was running somewhat low so Lasix was discontinued-she has no diuretics currently.  Patient also apparently has some loose stools before discharge this did again have some blood in it this was thought secondary to nosebleed her hemoglobin was stable.  Currently she appears to be doing very well--- ambulating about the facility does not complaining shortness of breath or chest pain--this essentially has been her baseline since she arrived in the facility.  She will be going home with her spouse also a daughter-she would benefit from a rolling walker for ambulation she appears to be doing quite well with-also would benefit from outpatient therapy with some continued weakness.  I do note she has seen cardiology since she arrived here and was deemed to be doing well there is again some thought that reevaluating her cardiac function in approximately 3 months--with apparently some thought of implanting a defibrillator. Her Coreg also was increased she appears to have tolerated this well-blood pressures appear to be stable recently 112/65-102/54-114/64-pulses appear to be largely in the 80s   .  Family medical social history as been reviewed per discharge note on 04/08/2014 .  Medications have been reviewed per MAR.  She is on aspirin 81 mg daily.  Lipitor 80 mg daily.  Co-reg  6.25 mg twice a day.  Lisinopril 2.5 mg daily.  Multivitamin daily.  Nitroglycerin 0.4 mg when necessary.  Protonix 40 mg daily.  Zoloft 25 mg daily.  Brilanta 90 mg twice a day .  review of systems.  In general she has no complaints of fever chills says she feels well  Skin does not complaining of any itching or rash.  Eyes denies any visual changes.  Ear nose mouth and throat- denies any nasal discharge or pain.  Respiratory-denies any shortness of breath or cough.  Cardiac denies any chest pain does not have really any  significant lower extremity edema does not complaining of palpitations.  GI does not complaining of any abdominal pain -no nausea or vomiting noted.--Says occasionally she will have a loose stool but this is better than when she was admitted here-per her daughter there is some history of this being chronic  GU does not complaining of any dysuria or burning.  Muscle skeletal is not complaining of any joint pain again she is up ambulating appears to be doing well with this.  Neurologic does not complaining of any dizziness or headache or syncopal-type feelings.  Psych apparently some history of anxiety and depression but this appears stable currently.    Physical exam.  Temperature 98.2 pulse 80 respirations 18 blood pressure 112/65 weight is 103.0 this appears to be a  loss of about 3 pounds since admission  In general this is a very pleasant elderly female in no distress.  Her skin is warm and dry .  Eyes pupils appear reactive to light sclera and conjunctiva are clear extraocular movements intact visual acuity appears grossly intact.  Oropharynx is clear mucous membranes moist.--Tongue is midline  Chest is clear to auscultation there is no labored breathing.  Heart is regular rate and rhythm without murmur gallop or rub she does not have significant lower extremity edema-she does have a life vest in place.  Abdomen is protuberant she says this is chronic it is soft nontender with active bowel sounds  GU-could not really appreciate any suprapubic tenderness.    Muscle skeletal moves all extremities x4 is ambulatory appears to be doing quite well with her walker.  Neurologic is grossly intact her speech is clear I do not see any lateralizing findings.  Psych she is alert and oriented x3 very pleasant and engaging.   Labs. 04/10/2014.  WBC 10.6 hemoglobin 10.7 platelets 283.  Sodium 134 potassium 4.2 BUN 17 creatinine 0.56.    04/08/2014.  WBC 13.6 hemoglobin 11.8 platelets  251-actually granulocytes elevated at 11.4.  Sodium 137 potassium 5 BUN 20 creatinine 0.66.  03/31/2014.  Liver function tests within normal limits except AST of 70 albumin of 3.3  Cholesterol was 193 HDL 69 LDL 104 triglycerides 99 .  Assessment plan.  #1-history of STEMI-this was thought secondary to total proximal LAD-with extensive wall motion abnormality-she continues on low-dose ACE inhibitor as well as Coreg and dual antiplatelet therapy with aspirin and Brilanta--- she had a stent placed -she appears to be doing quite well clinically currently no complaints of chest pain ambulating about the facility.  At this point continue to monitor.   #2 history of ischemic cardiomyopathy with ejection fraction 15-20%-she's not on a diuretic secondary to low blood pressures--she does not really have any significant edema currently it appears she has not gained any weight --she does have a life vest and is followed by cardiology- .  #3 hypertension-again she is on low-dose lisinopril and Coreg --  blood pressure  stable she is asymptomatic .  #4-history hyperlipidemia LDL was elevated at 100 at the hospital she is on high-dose Lipitor-liver function tests were fairly unremarkable except for an AST mildly elevated at 70.   #  5 leukocytosis- This appears minimal  Per  recent lab decreased from admission white count of over 13-apparently diarrhea has resolved -- does not appear symptomatic of any urinary tract infection or respiratory issues she did have a culture done of urine that grew out  Only  75,000 colonies of Escherichia coli.--Will update CBC with differential before discharge-notify primary care providerof results   #6-history of heme-positive stool--- this was thought possibly related to previous epistaxis-Will update CBC    #7 history of chronic anxiety-depression?-Apparently Zoloft was started in the hospital-she appears stable in this regards today  Again she appears to be quite stable  despite her fragile cardiac status-she does have cardiac rehabilitation scheduled to start on August 18-continues with her life vest external defibrillator which again is followed by cardiology-she would benefit from having a rolling walker as well as outpatient therapy secondary to her weakness which appears to be improving.  Again she will be with her spouse and daughter who are very supportive-.  [id number] note greater than 30 minutes spent preparing this discharge summary   .

## 2014-05-04 ENCOUNTER — Encounter (HOSPITAL_COMMUNITY): Payer: Self-pay

## 2014-05-04 ENCOUNTER — Encounter (HOSPITAL_COMMUNITY)
Admit: 2014-05-04 | Discharge: 2014-05-04 | Disposition: A | Discharge status: Home / Self Care | Payer: Medicare Other | Source: Ambulatory Visit | Attending: Cardiology | Admitting: Cardiology

## 2014-05-04 VITALS — BP 92/64 | HR 64 | Ht 60.0 in | Wt 104.3 lb

## 2014-05-04 DIAGNOSIS — I2109 ST elevation (STEMI) myocardial infarction involving other coronary artery of anterior wall: Secondary | ICD-10-CM | POA: Insufficient documentation

## 2014-05-04 DIAGNOSIS — Z9861 Coronary angioplasty status: Secondary | ICD-10-CM | POA: Insufficient documentation

## 2014-05-04 DIAGNOSIS — I2129 ST elevation (STEMI) myocardial infarction involving other sites: Secondary | ICD-10-CM

## 2014-05-04 DIAGNOSIS — Z5189 Encounter for other specified aftercare: Secondary | ICD-10-CM | POA: Insufficient documentation

## 2014-05-04 DIAGNOSIS — Z955 Presence of coronary angioplasty implant and graft: Secondary | ICD-10-CM

## 2014-05-04 NOTE — Progress Notes (Signed)
Patient referred to Cardiac Rehab by Dr. Diona BrownerMcDowell post STEMI 410.10 with coronary Stent V45.82. During orientation advised patient on arrival and appointment times what to wear, what to do before, during and after exercise. Reviewed attendance and class policy. Talked about inclement weather and class consultation policy. Pt is scheduled to start Cardiac Rehab on 05/05/14 at 9:30 am. Pt was advised to come to class 5 minutes before class starts. He was also given instructions on meeting with the dietician and attending the Family Structure classes. Pt is eager to get started. Patient was able to complete 6 minute walk test.

## 2014-05-04 NOTE — Patient Instructions (Signed)
Pt has finished orientation and is scheduled to start CR on 05/05/14 at 9:30. Pt has been instructed to arrive to class 15 minutes early for scheduled class. Pt has been instructed to wear comfortable clothing and shoes with rubber soles. Pt has been told to take their medications 1 hour prior to coming to class.  If the patient is not going to attend class, he/she has been instructed to call.

## 2014-05-05 ENCOUNTER — Encounter (HOSPITAL_COMMUNITY)
Admission: RE | Admit: 2014-05-05 | Discharge: 2014-05-05 | Disposition: A | Discharge status: Home / Self Care | Payer: Medicare Other | Source: Ambulatory Visit | Attending: Cardiology | Admitting: Cardiology

## 2014-05-05 DIAGNOSIS — Z5189 Encounter for other specified aftercare: Secondary | ICD-10-CM | POA: Diagnosis not present

## 2014-05-05 DIAGNOSIS — I2109 ST elevation (STEMI) myocardial infarction involving other coronary artery of anterior wall: Secondary | ICD-10-CM | POA: Diagnosis not present

## 2014-05-05 DIAGNOSIS — Z9861 Coronary angioplasty status: Secondary | ICD-10-CM | POA: Diagnosis not present

## 2014-05-07 ENCOUNTER — Encounter (HOSPITAL_COMMUNITY)
Admission: RE | Admit: 2014-05-07 | Discharge: 2014-05-07 | Disposition: A | Discharge status: Home / Self Care | Payer: Medicare Other | Source: Ambulatory Visit | Attending: Cardiology | Admitting: Cardiology

## 2014-05-07 DIAGNOSIS — Z5189 Encounter for other specified aftercare: Secondary | ICD-10-CM | POA: Diagnosis not present

## 2014-05-10 ENCOUNTER — Encounter (HOSPITAL_COMMUNITY)
Admission: RE | Admit: 2014-05-10 | Discharge: 2014-05-10 | Disposition: A | Discharge status: Home / Self Care | Payer: Medicare Other | Source: Ambulatory Visit | Attending: Cardiology | Admitting: Cardiology

## 2014-05-10 DIAGNOSIS — Z5189 Encounter for other specified aftercare: Secondary | ICD-10-CM | POA: Diagnosis not present

## 2014-05-12 ENCOUNTER — Encounter (HOSPITAL_COMMUNITY)
Admission: RE | Admit: 2014-05-12 | Discharge: 2014-05-12 | Disposition: A | Discharge status: Home / Self Care | Payer: Medicare Other | Source: Ambulatory Visit | Attending: Cardiology | Admitting: Cardiology

## 2014-05-12 DIAGNOSIS — Z5189 Encounter for other specified aftercare: Secondary | ICD-10-CM | POA: Diagnosis not present

## 2014-05-14 ENCOUNTER — Encounter (HOSPITAL_COMMUNITY)
Admission: RE | Admit: 2014-05-14 | Discharge: 2014-05-14 | Disposition: A | Discharge status: Home / Self Care | Payer: Medicare Other | Source: Ambulatory Visit | Attending: Cardiology | Admitting: Cardiology

## 2014-05-14 DIAGNOSIS — Z5189 Encounter for other specified aftercare: Secondary | ICD-10-CM | POA: Diagnosis not present

## 2014-05-17 ENCOUNTER — Encounter (HOSPITAL_COMMUNITY)
Admission: RE | Admit: 2014-05-17 | Discharge: 2014-05-17 | Disposition: A | Discharge status: Home / Self Care | Payer: Medicare Other | Source: Ambulatory Visit | Attending: Cardiology | Admitting: Cardiology

## 2014-05-17 DIAGNOSIS — Z5189 Encounter for other specified aftercare: Secondary | ICD-10-CM | POA: Diagnosis not present

## 2014-05-19 ENCOUNTER — Encounter (HOSPITAL_COMMUNITY)
Admission: RE | Admit: 2014-05-19 | Discharge: 2014-05-19 | Disposition: A | Discharge status: Home / Self Care | Payer: Medicare Other | Source: Ambulatory Visit | Attending: Cardiology | Admitting: Cardiology

## 2014-05-19 DIAGNOSIS — I2109 ST elevation (STEMI) myocardial infarction involving other coronary artery of anterior wall: Secondary | ICD-10-CM | POA: Insufficient documentation

## 2014-05-19 DIAGNOSIS — Z9861 Coronary angioplasty status: Secondary | ICD-10-CM | POA: Diagnosis not present

## 2014-05-19 DIAGNOSIS — Z5189 Encounter for other specified aftercare: Secondary | ICD-10-CM | POA: Diagnosis present

## 2014-05-21 ENCOUNTER — Encounter (HOSPITAL_COMMUNITY): Payer: Medicare Other

## 2014-05-24 ENCOUNTER — Encounter (HOSPITAL_COMMUNITY): Payer: Medicare Other

## 2014-05-26 ENCOUNTER — Telehealth: Payer: Self-pay | Admitting: *Deleted

## 2014-05-26 ENCOUNTER — Encounter (HOSPITAL_COMMUNITY)
Admission: RE | Admit: 2014-05-26 | Discharge: 2014-05-26 | Disposition: A | Discharge status: Home / Self Care | Payer: Medicare Other | Source: Ambulatory Visit | Attending: Cardiology | Admitting: Cardiology

## 2014-05-26 DIAGNOSIS — Z5189 Encounter for other specified aftercare: Secondary | ICD-10-CM | POA: Diagnosis not present

## 2014-05-26 LAB — HEPATIC FUNCTION PANEL
ALT: 55 U/L — ABNORMAL HIGH (ref 0–35)
AST: 31 U/L (ref 0–37)
Albumin: 3.8 g/dL (ref 3.5–5.2)
Alkaline Phosphatase: 81 U/L (ref 39–117)
BILIRUBIN DIRECT: 0.2 mg/dL (ref 0.0–0.3)
BILIRUBIN INDIRECT: 0.5 mg/dL (ref 0.2–1.2)
TOTAL PROTEIN: 6.3 g/dL (ref 6.0–8.3)
Total Bilirubin: 0.7 mg/dL (ref 0.2–1.2)

## 2014-05-26 LAB — LIPID PANEL
Cholesterol: 113 mg/dL (ref 0–200)
HDL: 59 mg/dL (ref >39)
LDL Cholesterol: 33 mg/dL (ref 0–99)
Total CHOL/HDL Ratio: 1.9 Ratio
Triglycerides: 103 mg/dL (ref <150)
VLDL: 21 mg/dL (ref 0–40)

## 2014-05-26 NOTE — Telephone Encounter (Signed)
Called pt to let her know that labs will be resulted in around 4 days and after MD results them, we would forward to Dr. Margo Aye. Pt understood

## 2014-05-26 NOTE — Telephone Encounter (Signed)
Pt had labs done this morning and would like results forwarded to dr zach hall also/tmj

## 2014-05-28 ENCOUNTER — Encounter (HOSPITAL_COMMUNITY): Payer: Medicare Other

## 2014-05-31 ENCOUNTER — Encounter (HOSPITAL_COMMUNITY)
Admission: RE | Admit: 2014-05-31 | Discharge: 2014-05-31 | Disposition: A | Discharge status: Home / Self Care | Payer: Medicare Other | Source: Ambulatory Visit | Attending: Cardiology | Admitting: Cardiology

## 2014-05-31 ENCOUNTER — Telehealth: Payer: Self-pay | Admitting: *Deleted

## 2014-05-31 DIAGNOSIS — Z5189 Encounter for other specified aftercare: Secondary | ICD-10-CM | POA: Diagnosis not present

## 2014-05-31 NOTE — Telephone Encounter (Signed)
She has an echo on 07/16/2014 to assess LV fx as it was so decreased on echo during hospitalization. She is supposed to see Dr. Diona Browner on follow up appt thereafter to discuss the results and need to continue Life Vest. Please make sure she has follow up appt scheduled, as I didn't see one listed. She cannot drive until LIfe-Vest is removed and she see Dr.McDowell to discuss echo results.

## 2014-05-31 NOTE — Telephone Encounter (Signed)
Pt daughter needs to know when patient can start driving/tmj

## 2014-05-31 NOTE — Telephone Encounter (Signed)
Will forward to [employer]

## 2014-05-31 NOTE — Telephone Encounter (Signed)
Spoke to pt daughter Darl Pikes, gave instructions not to drive until seen by Dr. Diona Browner and understood. She said she will make appt with Dr. Diona Browner, and will check at home as she thinks she had already made one.

## 2014-06-02 ENCOUNTER — Encounter (HOSPITAL_COMMUNITY)
Admission: RE | Admit: 2014-06-02 | Discharge: 2014-06-02 | Disposition: A | Discharge status: Home / Self Care | Payer: Medicare Other | Source: Ambulatory Visit | Attending: Cardiology | Admitting: Cardiology

## 2014-06-02 DIAGNOSIS — Z5189 Encounter for other specified aftercare: Secondary | ICD-10-CM | POA: Diagnosis not present

## 2014-06-04 ENCOUNTER — Encounter (HOSPITAL_COMMUNITY)
Admission: RE | Admit: 2014-06-04 | Discharge: 2014-06-04 | Disposition: A | Discharge status: Home / Self Care | Payer: Medicare Other | Source: Ambulatory Visit | Attending: Cardiology | Admitting: Cardiology

## 2014-06-04 DIAGNOSIS — Z5189 Encounter for other specified aftercare: Secondary | ICD-10-CM | POA: Diagnosis not present

## 2014-06-07 ENCOUNTER — Encounter (HOSPITAL_COMMUNITY)
Admission: RE | Admit: 2014-06-07 | Discharge: 2014-06-07 | Disposition: A | Discharge status: Home / Self Care | Payer: Medicare Other | Source: Ambulatory Visit | Attending: Cardiology | Admitting: Cardiology

## 2014-06-07 DIAGNOSIS — Z5189 Encounter for other specified aftercare: Secondary | ICD-10-CM | POA: Diagnosis not present

## 2014-06-09 ENCOUNTER — Encounter (HOSPITAL_COMMUNITY)
Admission: RE | Admit: 2014-06-09 | Discharge: 2014-06-09 | Disposition: A | Discharge status: Home / Self Care | Payer: Medicare Other | Source: Ambulatory Visit | Attending: Cardiology | Admitting: Cardiology

## 2014-06-09 DIAGNOSIS — Z5189 Encounter for other specified aftercare: Secondary | ICD-10-CM | POA: Diagnosis not present

## 2014-06-10 ENCOUNTER — Ambulatory Visit (INDEPENDENT_AMBULATORY_CARE_PROVIDER_SITE_OTHER): Payer: Medicare Other | Admitting: Cardiology

## 2014-06-10 ENCOUNTER — Encounter: Payer: Self-pay | Admitting: Cardiology

## 2014-06-10 VITALS — BP 138/78 | HR 76 | Ht 61.0 in | Wt 105.0 lb

## 2014-06-10 DIAGNOSIS — I2510 Atherosclerotic heart disease of native coronary artery without angina pectoris: Secondary | ICD-10-CM

## 2014-06-10 DIAGNOSIS — I2589 Other forms of chronic ischemic heart disease: Secondary | ICD-10-CM

## 2014-06-10 DIAGNOSIS — I255 Ischemic cardiomyopathy: Secondary | ICD-10-CM

## 2014-06-10 DIAGNOSIS — E785 Hyperlipidemia, unspecified: Secondary | ICD-10-CM

## 2014-06-10 MED ORDER — ATORVASTATIN CALCIUM 20 MG PO TABS: 20.0000 mg | ORAL_TABLET | Freq: Every day | ORAL | Status: DC

## 2014-06-10 NOTE — Patient Instructions (Signed)
Your physician recommends that you schedule a follow-up appointment in: 1 month after echo in October already scheduled     Your physician has recommended you make the following change in your medication:    DECREASE Lipitor to 20 mg daily     Thank you for choosing [facility] !

## 2014-06-10 NOTE — Assessment & Plan Note (Signed)
Recent LDL 33 and ALT slightly elevated at 55. Reduce Lipitor to 20 mg daily.

## 2014-06-10 NOTE — Progress Notes (Signed)
Clinical Summary Ms. Hendriks is an 80 y.o.female presenting to the office for followup. She was most recently seen by Ms. Lilla Shook in July following hospitalization with anterior STEMI requiring DES to a proximally occluded LAD, also associated with cardiomyopathy and LVEF 15-20%. The last time I saw her was in October 2014. She is here with her daughter today. Overall, states that she has been doing reasonably well without chest pain, tolerating her medications. Does have some easy bruising on DAPT. No major bleeding episodes however. Reports that cardiac rehabilitation makes her somewhat tired, but she does seem to be improving her stamina.  Dr. Swaz[place] saw the patient at discharge and arranged for her to have Lifevest ICD pending followup assessment of LVEF. She is still wearing the device. She has had no discharges, no palpitations or syncope. I asked her today whether she would even be willing to consider having an implantable defibrillator put in if herLVEF remained under 35%. She told me that she was actually not sure and was still considering the matter. Followup echocardiogram is planned for late October.  Recent lipid panel showed cholesterol 113, triglycerides 103, HDL 59, LDL 33, AST 31, and ALT 55 (slightly elevated). Reviewed this with the patient and her daughter, gave them a copy of the results. Recommended that we reduce Lipitor dose for now.   Allergies  Allergen Reactions  . Alprazolam Other (See Comments)    "puts me to sleep for days"  . Caffeine Other (See Comments)    "peps me up"    Current Outpatient Prescriptions  Medication Sig Dispense Refill  . aspirin EC 81 MG EC tablet Take 1 tablet (81 mg total) by mouth daily.      Marland Kitchen atorvastatin (LIPITOR) 20 MG tablet Take 1 tablet (20 mg total) by mouth daily at 6 PM.  30 tablet  11  . carvedilol (COREG) 6.25 MG tablet Take 1 tablet (6.25 mg total) by mouth 2 (two) times daily with a meal.  60 tablet  11  . lisinopril  (PRINIVIL,ZESTRIL) 2.5 MG tablet Take 1 tablet (2.5 mg total) by mouth daily.  30 tablet  11  . Multiple Vitamin (MULTIVITAMIN WITH MINERALS) TABS Take 1 tablet by mouth daily.      . nitroGLYCERIN (NITROSTAT) 0.4 MG SL tablet Place 1 tablet (0.4 mg total) under the tongue every 5 (five) minutes x 3 doses as needed for chest pain.  25 tablet  12  . pantoprazole (PROTONIX) 40 MG tablet Take 1 tablet (40 mg total) by mouth daily.  30 tablet  11  . sertraline (ZOLOFT) 25 MG tablet Take 1 tablet (25 mg total) by mouth daily.  30 tablet  11  . ticagrelor (BRILINTA) 90 MG TABS tablet Take 1 tablet (90 mg total) by mouth 2 (two) times daily.  60 tablet  11   No current facility-administered medications for this visit.    Past Medical History  Diagnosis Date  . Essential hypertension, benign   . Anemia     Postoperative; hemoglobin of 11.5 in 04/2011  . LVH (left ventricular hypertrophy)     HOCM physiology  . Anxiety   . Hyperlipidemia   . Arthritis   . Palpitations   . Coronary atherosclerosis of native coronary artery     a. 03/31/14 Ant STEMI s/p DES to LAD; 40-50% diagonal 2 sten and 40% LAD sten  . Ischemic cardiomyopathy     a. 03/31/14 EF 15-20% w/ antersept/apical akinesis- discharged with life vest  Social History Ms. Kadar reports that she has never smoked. She has never used smokeless tobacco. Ms. Melby reports that she does not drink alcohol.  Review of Systems Patient somewhat "stressed." She tells me that her husband is nearing death, in a nursing home. Other systems reviewed and negative except as outlined.  Physical Examination Filed Vitals:   06/10/14 1023  BP: 138/78  Pulse: 76   Filed Weights   06/10/14 1023  Weight: 105 lb (47.628 kg)   Elderly woman, appears comfortable at rest, wearing Lifevest ICD. HEENT: Conjunctiva and lids normal, oropharynx clear. Neck: Supple, no elevated JVP or carotid bruits, no thyromegaly. Lungs: Clear to auscultation, nonlabored  breathing at rest. Cardiac: Regular rate and rhythm, no S3, soft systolic murmur, no pericardial rub. Abdomen: Soft, nontender, bowel sounds present. Extremities: No pitting edema, distal pulses 2+. Skin: Warm and dry. Resolving ecchymosis on the right leg. Musculoskeletal: No kyphosis. Neuropsychiatric: Alert and oriented x3, affect grossly appropriate.   Problem List and Plan   Ischemic cardiomyopathy LVEF was approximately 20% around the time of her acute anterior infarct, now status post revascularization, wearing Lifevest ICD. Patient tells me that she is not certain she would consider an implantable defibrillator, I asked her to think this through and discuss it with her daughter. Followup echocardiogram is arranged for later in October.  Coronary atherosclerosis of native coronary artery No active angina symptoms status post DES to the LAD in July. Continue DAPT. She is in cardiac rehabilitation.  Hyperlipidemia Recent LDL 33 and ALT slightly elevated at 55. Reduce Lipitor to 20 mg daily.    Jonelle Sidle, M.D., F.A.C.C.

## 2014-06-10 NOTE — Assessment & Plan Note (Signed)
LVEF was approximately 20% around the time of her acute anterior infarct, now status post revascularization, wearing Lifevest ICD. Patient tells me that she is not certain she would consider an implantable defibrillator, I asked her to think this through and discuss it with her daughter. Followup echocardiogram is arranged for later in October.

## 2014-06-10 NOTE — Assessment & Plan Note (Addendum)
No active angina symptoms status post DES to the LAD in July. Continue DAPT. She is in cardiac rehabilitation.

## 2014-06-11 ENCOUNTER — Encounter (HOSPITAL_COMMUNITY)
Admission: RE | Admit: 2014-06-11 | Discharge: 2014-06-11 | Disposition: A | Discharge status: Home / Self Care | Payer: Medicare Other | Source: Ambulatory Visit | Attending: Cardiology | Admitting: Cardiology

## 2014-06-11 DIAGNOSIS — Z5189 Encounter for other specified aftercare: Secondary | ICD-10-CM | POA: Diagnosis not present

## 2014-06-14 ENCOUNTER — Encounter (HOSPITAL_COMMUNITY)
Admission: RE | Admit: 2014-06-14 | Discharge: 2014-06-14 | Disposition: A | Discharge status: Home / Self Care | Payer: Medicare Other | Source: Ambulatory Visit | Attending: Cardiology | Admitting: Cardiology

## 2014-06-14 DIAGNOSIS — Z5189 Encounter for other specified aftercare: Secondary | ICD-10-CM | POA: Diagnosis not present

## 2014-06-16 ENCOUNTER — Encounter (HOSPITAL_COMMUNITY)
Admission: RE | Admit: 2014-06-16 | Discharge: 2014-06-16 | Disposition: A | Discharge status: Home / Self Care | Payer: Medicare Other | Source: Ambulatory Visit | Attending: Cardiology | Admitting: Cardiology

## 2014-06-16 DIAGNOSIS — Z5189 Encounter for other specified aftercare: Secondary | ICD-10-CM | POA: Diagnosis not present

## 2014-06-18 ENCOUNTER — Encounter (HOSPITAL_COMMUNITY)
Admission: RE | Admit: 2014-06-18 | Discharge: 2014-06-18 | Disposition: A | Discharge status: Home / Self Care | Payer: Medicare Other | Source: Ambulatory Visit | Attending: Cardiology | Admitting: Cardiology

## 2014-06-18 DIAGNOSIS — I2510 Atherosclerotic heart disease of native coronary artery without angina pectoris: Secondary | ICD-10-CM | POA: Insufficient documentation

## 2014-06-18 DIAGNOSIS — I517 Cardiomegaly: Secondary | ICD-10-CM | POA: Diagnosis not present

## 2014-06-21 ENCOUNTER — Encounter (HOSPITAL_COMMUNITY): Payer: Medicare Other

## 2014-06-23 ENCOUNTER — Encounter (HOSPITAL_COMMUNITY): Payer: Medicare Other

## 2014-06-25 ENCOUNTER — Encounter (HOSPITAL_COMMUNITY)
Admission: RE | Admit: 2014-06-25 | Discharge: 2014-06-25 | Disposition: A | Discharge status: Home / Self Care | Payer: Medicare Other | Source: Ambulatory Visit | Attending: Cardiology | Admitting: Cardiology

## 2014-06-25 DIAGNOSIS — I517 Cardiomegaly: Secondary | ICD-10-CM | POA: Diagnosis not present

## 2014-06-28 ENCOUNTER — Encounter (HOSPITAL_COMMUNITY)
Admission: RE | Admit: 2014-06-28 | Discharge: 2014-06-28 | Disposition: A | Discharge status: Home / Self Care | Payer: Medicare Other | Source: Ambulatory Visit | Attending: Cardiology | Admitting: Cardiology

## 2014-06-28 DIAGNOSIS — I517 Cardiomegaly: Secondary | ICD-10-CM | POA: Diagnosis not present

## 2014-06-30 ENCOUNTER — Encounter (HOSPITAL_COMMUNITY)
Admission: RE | Admit: 2014-06-30 | Discharge: 2014-06-30 | Disposition: A | Discharge status: Home / Self Care | Payer: Medicare Other | Source: Ambulatory Visit | Attending: Cardiology | Admitting: Cardiology

## 2014-06-30 DIAGNOSIS — I517 Cardiomegaly: Secondary | ICD-10-CM | POA: Diagnosis not present

## 2014-07-02 ENCOUNTER — Encounter (HOSPITAL_COMMUNITY)
Admission: RE | Admit: 2014-07-02 | Discharge: 2014-07-02 | Disposition: A | Discharge status: Home / Self Care | Payer: Medicare Other | Source: Ambulatory Visit | Attending: Cardiology | Admitting: Cardiology

## 2014-07-02 ENCOUNTER — Other Ambulatory Visit: Payer: Self-pay

## 2014-07-02 DIAGNOSIS — I517 Cardiomegaly: Secondary | ICD-10-CM | POA: Diagnosis not present

## 2014-07-05 ENCOUNTER — Encounter (HOSPITAL_COMMUNITY)
Admission: RE | Admit: 2014-07-05 | Discharge: 2014-07-05 | Disposition: A | Discharge status: Home / Self Care | Payer: Medicare Other | Source: Ambulatory Visit | Attending: Cardiology | Admitting: Cardiology

## 2014-07-05 ENCOUNTER — Encounter: Payer: Self-pay | Admitting: Adult Health

## 2014-07-05 ENCOUNTER — Encounter: Payer: Self-pay | Admitting: Cardiology

## 2014-07-05 DIAGNOSIS — I517 Cardiomegaly: Secondary | ICD-10-CM | POA: Diagnosis not present

## 2014-07-06 NOTE — Progress Notes (Signed)
Cardiac Rehabilitation Program Outcomes Report   Orientation:  05/04/14 Graduate Date:  tbd Discharge Date:  tbd # of sessions completed: 3 sessions  Cardiologist: Diona BrownerMcDowell Family MD:  Regino SchultzeMcGough Class Time:  0930  A.  Exercise Program:  Tolerates exercise @ 1.62 METS for 15 minutes  B.  Mental Health:  Good mental attitude  C.  Education/Instruction/Skills  Accurately checks own pulse.  Rest:  80  Exercise:  97  Uses Perceived Exertion Scale and/or Dyspnea Scale  D.  Nutrition/Weight Control/Body Composition:  Adherence to prescribed nutrition program: good    E.  Blood Lipids    Lab Results  Component Value Date   CHOL 113 05/26/2014   HDL 59 05/26/2014   LDLCALC 33 05/26/2014   LDLDIRECT 135* 02/02/2014   TRIG 103 05/26/2014   CHOLHDL 1.9 05/26/2014    F.  Lifestyle Changes:  Making positive lifestyle changes  G.  Symptoms noted with exercise:  Dizziness  Report Completed By:  Enid Derryhristy Jasmeen Fritsch RN   Comments:  First week progress note.

## 2014-07-06 NOTE — Progress Notes (Signed)
Cardiac Rehabilitation Program Outcomes Report   Orientation:  05/04/14 Graduate Date:  tbd Discharge Date:  tbd # of sessions completed: 18  Cardiologist: Hermina StaggersMcDowell Family MD:  Regino SchultzeMcGough  Class Time:  0930  A.  Exercise Program:  Tolerates exercise @ 1.6 METS for 15 minutes  B.  Mental Health:  Good mental attitude  C.  Education/Instruction/Skills  Accurately checks own pulse.  Rest:  78  Exercise:  96 and Knows THR for exercise  Uses Perceived Exertion Scale and/or Dyspnea Scale  D.  Nutrition/Weight Control/Body Composition:  Adherence to prescribed nutrition program: good    E.  Blood Lipids    Lab Results  Component Value Date   CHOL 113 05/26/2014   HDL 59 05/26/2014   LDLCALC 33 05/26/2014   LDLDIRECT 135* 02/02/2014   TRIG 103 05/26/2014   CHOLHDL 1.9 05/26/2014    F.  Lifestyle Changes:  Making positive lifestyle changes  G.  Symptoms noted with exercise:  Fatigue during session 5 and 6  Report Completed By:  Enid Derryhristy Rea Kalama RN   Comments:  Half way progress note.

## 2014-07-07 ENCOUNTER — Encounter (HOSPITAL_COMMUNITY)
Admission: RE | Admit: 2014-07-07 | Discharge: 2014-07-07 | Disposition: A | Discharge status: Home / Self Care | Payer: Medicare Other | Source: Ambulatory Visit | Attending: Cardiology | Admitting: Cardiology

## 2014-07-07 DIAGNOSIS — I517 Cardiomegaly: Secondary | ICD-10-CM | POA: Diagnosis not present

## 2014-07-09 ENCOUNTER — Encounter (HOSPITAL_COMMUNITY)
Admission: RE | Admit: 2014-07-09 | Discharge: 2014-07-09 | Disposition: A | Discharge status: Home / Self Care | Payer: Medicare Other | Source: Ambulatory Visit | Attending: Cardiology | Admitting: Cardiology

## 2014-07-09 DIAGNOSIS — I517 Cardiomegaly: Secondary | ICD-10-CM | POA: Diagnosis not present

## 2014-07-12 ENCOUNTER — Telehealth: Payer: Self-pay | Admitting: *Deleted

## 2014-07-12 ENCOUNTER — Encounter (HOSPITAL_COMMUNITY)
Admission: RE | Admit: 2014-07-12 | Discharge: 2014-07-12 | Disposition: A | Discharge status: Home / Self Care | Payer: Medicare Other | Source: Ambulatory Visit | Attending: Cardiology | Admitting: Cardiology

## 2014-07-12 DIAGNOSIS — I517 Cardiomegaly: Secondary | ICD-10-CM | POA: Diagnosis not present

## 2014-07-12 NOTE — Telephone Encounter (Signed)
F/u call after pt email to change appt before November 20. Moved to 11/17. CX 11/20 appt. Pt scheduled for echo 10/30. Would like a f/u up call after echo has been resulted.

## 2014-07-14 ENCOUNTER — Encounter (HOSPITAL_COMMUNITY)
Admission: RE | Admit: 2014-07-14 | Discharge: 2014-07-14 | Disposition: A | Discharge status: Home / Self Care | Payer: Medicare Other | Source: Ambulatory Visit | Attending: Cardiology | Admitting: Cardiology

## 2014-07-14 DIAGNOSIS — I517 Cardiomegaly: Secondary | ICD-10-CM | POA: Diagnosis not present

## 2014-07-16 ENCOUNTER — Encounter (HOSPITAL_COMMUNITY)
Admission: RE | Admit: 2014-07-16 | Discharge: 2014-07-16 | Disposition: A | Discharge status: Home / Self Care | Payer: Medicare Other | Source: Ambulatory Visit | Attending: Cardiology | Admitting: Cardiology

## 2014-07-16 ENCOUNTER — Ambulatory Visit (HOSPITAL_COMMUNITY)
Admit: 2014-07-16 | Discharge: 2014-07-16 | Disposition: A | Discharge status: Home / Self Care | Payer: Medicare Other | Source: Ambulatory Visit | Attending: Physician Assistant | Admitting: Physician Assistant

## 2014-07-16 DIAGNOSIS — I10 Essential (primary) hypertension: Secondary | ICD-10-CM | POA: Diagnosis not present

## 2014-07-16 DIAGNOSIS — I371 Nonrheumatic pulmonary valve insufficiency: Secondary | ICD-10-CM | POA: Insufficient documentation

## 2014-07-16 DIAGNOSIS — I071 Rheumatic tricuspid insufficiency: Secondary | ICD-10-CM | POA: Diagnosis not present

## 2014-07-16 DIAGNOSIS — I2510 Atherosclerotic heart disease of native coronary artery without angina pectoris: Secondary | ICD-10-CM | POA: Diagnosis not present

## 2014-07-16 DIAGNOSIS — I379 Nonrheumatic pulmonary valve disorder, unspecified: Secondary | ICD-10-CM

## 2014-07-16 DIAGNOSIS — E785 Hyperlipidemia, unspecified: Secondary | ICD-10-CM | POA: Insufficient documentation

## 2014-07-16 DIAGNOSIS — I517 Cardiomegaly: Secondary | ICD-10-CM

## 2014-07-16 DIAGNOSIS — I25119 Atherosclerotic heart disease of native coronary artery with unspecified angina pectoris: Secondary | ICD-10-CM

## 2014-07-16 NOTE — Progress Notes (Signed)
  Echocardiogram 2D Echocardiogram has been performed.  [name] 07/16/2014, 3:05 PM

## 2014-07-19 ENCOUNTER — Telehealth: Payer: Self-pay

## 2014-07-19 ENCOUNTER — Encounter (HOSPITAL_COMMUNITY)
Admission: RE | Admit: 2014-07-19 | Discharge: 2014-07-19 | Disposition: A | Discharge status: Home / Self Care | Payer: Medicare Other | Source: Ambulatory Visit | Attending: Cardiology | Admitting: Cardiology

## 2014-07-19 DIAGNOSIS — I2510 Atherosclerotic heart disease of native coronary artery without angina pectoris: Secondary | ICD-10-CM | POA: Insufficient documentation

## 2014-07-19 DIAGNOSIS — I10 Essential (primary) hypertension: Secondary | ICD-10-CM | POA: Diagnosis not present

## 2014-07-19 DIAGNOSIS — I255 Ischemic cardiomyopathy: Secondary | ICD-10-CM | POA: Diagnosis not present

## 2014-07-19 DIAGNOSIS — E785 Hyperlipidemia, unspecified: Secondary | ICD-10-CM | POA: Insufficient documentation

## 2014-07-19 NOTE — Telephone Encounter (Signed)
LMTCB

## 2014-07-19 NOTE — Telephone Encounter (Signed)
-----  Message from Satira Sark, MD sent at 07/19/2014  8:49 AM EST ----- I saw Ms. [name] in follow-up of her acute infarct with cardiomyopathy. She has been wearing a LifeVest ICD (placed by Dr. Martinique). When I last met with her in the office, she was not certain whether she would pursue an actual ICD implantation or not. Please let her know that I reviewed the echocardiogram report, and LVEF still remains decreased, 20-25% range. This means she would be a candidate for an implantable ICD, and we can certainly refer her for EP consultation if she would like to pursue this. However at her age, I would certainly understand it if she inclines. We will continue to follow her either way. Please see what she would like to do.

## 2014-07-19 NOTE — Telephone Encounter (Signed)
Pt wants to discuss with Dr.McDowell at next visit

## 2014-07-19 NOTE — Telephone Encounter (Signed)
-----  Message from Satira Sark, MD sent at 07/19/2014  8:49 AM EST ----- I saw [name] in follow-up of her acute infarct with cardiomyopathy. She has been wearing a LifeVest ICD (placed by Dr. Martinique). When I last met with her in the office, she was not certain whether she would pursue an actual ICD implantation or not. Please let her know that I reviewed the echocardiogram report, and LVEF still remains decreased, 20-25% range. This means she would be a candidate for an implantable ICD, and we can certainly refer her for EP consultation if she would like to pursue this. However at her age, I would certainly understand it if she inclines. We will continue to follow her either way. Please see what she would like to do.

## 2014-07-21 ENCOUNTER — Encounter (HOSPITAL_COMMUNITY): Payer: Medicare Other

## 2014-07-21 DIAGNOSIS — I2510 Atherosclerotic heart disease of native coronary artery without angina pectoris: Secondary | ICD-10-CM | POA: Diagnosis not present

## 2014-07-23 ENCOUNTER — Encounter (HOSPITAL_COMMUNITY)
Admission: RE | Admit: 2014-07-23 | Discharge: 2014-07-23 | Disposition: A | Discharge status: Home / Self Care | Payer: Medicare Other | Source: Ambulatory Visit | Attending: Cardiology | Admitting: Cardiology

## 2014-07-23 DIAGNOSIS — I2510 Atherosclerotic heart disease of native coronary artery without angina pectoris: Secondary | ICD-10-CM | POA: Diagnosis not present

## 2014-07-26 ENCOUNTER — Encounter (HOSPITAL_COMMUNITY)
Admission: RE | Admit: 2014-07-26 | Discharge: 2014-07-26 | Disposition: A | Discharge status: Home / Self Care | Payer: Medicare Other | Source: Ambulatory Visit | Attending: Cardiology | Admitting: Cardiology

## 2014-07-26 DIAGNOSIS — I2510 Atherosclerotic heart disease of native coronary artery without angina pectoris: Secondary | ICD-10-CM | POA: Diagnosis not present

## 2014-07-28 ENCOUNTER — Encounter (HOSPITAL_COMMUNITY)
Admission: RE | Admit: 2014-07-28 | Discharge: 2014-07-28 | Disposition: A | Discharge status: Home / Self Care | Payer: Medicare Other | Source: Ambulatory Visit | Attending: Cardiology | Admitting: Cardiology

## 2014-07-28 DIAGNOSIS — I2510 Atherosclerotic heart disease of native coronary artery without angina pectoris: Secondary | ICD-10-CM | POA: Diagnosis not present

## 2014-07-30 ENCOUNTER — Encounter (HOSPITAL_COMMUNITY)
Admission: RE | Admit: 2014-07-30 | Discharge: 2014-07-30 | Disposition: A | Discharge status: Home / Self Care | Payer: Medicare Other | Source: Ambulatory Visit | Attending: Cardiology | Admitting: Cardiology

## 2014-07-30 DIAGNOSIS — I2510 Atherosclerotic heart disease of native coronary artery without angina pectoris: Secondary | ICD-10-CM | POA: Diagnosis not present

## 2014-08-02 ENCOUNTER — Encounter (HOSPITAL_COMMUNITY)
Admission: RE | Admit: 2014-08-02 | Discharge: 2014-08-02 | Disposition: A | Discharge status: Home / Self Care | Payer: Medicare Other | Source: Ambulatory Visit | Attending: Cardiology | Admitting: Cardiology

## 2014-08-02 DIAGNOSIS — I2510 Atherosclerotic heart disease of native coronary artery without angina pectoris: Secondary | ICD-10-CM | POA: Diagnosis not present

## 2014-08-03 ENCOUNTER — Ambulatory Visit (INDEPENDENT_AMBULATORY_CARE_PROVIDER_SITE_OTHER): Payer: Medicare Other | Admitting: Cardiology

## 2014-08-03 ENCOUNTER — Encounter: Payer: Self-pay | Admitting: Cardiology

## 2014-08-03 VITALS — BP 142/80 | HR 83 | Ht 60.0 in | Wt 104.0 lb

## 2014-08-03 DIAGNOSIS — I2510 Atherosclerotic heart disease of native coronary artery without angina pectoris: Secondary | ICD-10-CM

## 2014-08-03 DIAGNOSIS — I255 Ischemic cardiomyopathy: Secondary | ICD-10-CM

## 2014-08-03 DIAGNOSIS — E785 Hyperlipidemia, unspecified: Secondary | ICD-10-CM

## 2014-08-03 DIAGNOSIS — I10 Essential (primary) hypertension: Secondary | ICD-10-CM

## 2014-08-03 NOTE — Assessment & Plan Note (Signed)
We will keep an eye on blood pressure to see if any further medication adjustments are needed. Generally this has been reasonably controlled.

## 2014-08-03 NOTE — Progress Notes (Signed)
Reason for visit: CAD, cardiomyopathy  Clinical Summary [name] is an 80 y.o.female last seen in September. She is here with her son today for a routine visit. She reports no angina symptoms, has had no shocks from her Lifevest ICD. She continues in cardiac rehabilitation. We reviewed her cardiac medications. She does have intermittent bruising on DAPT, but no major bleeding episodes.  Follow-up echocardiogram in late October revealed basal LV septal hypertrophy with LVEF 20-25%, grade 1 diastolic dysfunction, and wall motion abnormalities consistent with ischemic cardiomyopathy. She has been wearing a Lifevest defibrillator since hospital discharge following her anterior wall infarct in July. This was arranged by Dr. SwazilandJordan. LVEF in July was in the 15-20% range.  Today we discussed her echocardiogram results, technically she still remains a potential candidate for prophylactic ICD to reduce the risk of sudden cardiac death. At our last visit, she was not certain whether she would pursue this or not, and remains undecided today. After discussion with the patient and her son, plan is to refer her for formal EP consultation to discuss this matter in detail and come to a final decision. I have explained to her that either way, we will continue to manage her medically and follow her along. She did have questions as to whether she might be able to return to driving and how this might impact decision for device.  Lipid panel in September showed cholesterol 113, triglycerides 103, HDL 59, and LDL 33.   Allergies  Allergen Reactions  . Alprazolam Other (See Comments)    "puts me to sleep for days"  . Caffeine Other (See Comments)    "peps me up"    Current Outpatient Prescriptions  Medication Sig Dispense Refill  . aspirin EC 81 MG EC tablet Take 1 tablet (81 mg total) by mouth daily.    Marland Kitchen. atorvastatin (LIPITOR) 20 MG tablet Take 1 tablet (20 mg total) by mouth daily at 6 PM. 30 tablet 11  .  carvedilol (COREG) 6.25 MG tablet Take 1 tablet (6.25 mg total) by mouth 2 (two) times daily with a meal. 60 tablet 11  . lisinopril (PRINIVIL,ZESTRIL) 2.5 MG tablet Take 1 tablet (2.5 mg total) by mouth daily. 30 tablet 11  . Multiple Vitamin (MULTIVITAMIN WITH MINERALS) TABS Take 1 tablet by mouth daily.    . nitroGLYCERIN (NITROSTAT) 0.4 MG SL tablet Place 1 tablet (0.4 mg total) under the tongue every 5 (five) minutes x 3 doses as needed for chest pain. 25 tablet 12  . pantoprazole (PROTONIX) 40 MG tablet Take 1 tablet (40 mg total) by mouth daily. 30 tablet 11  . sertraline (ZOLOFT) 25 MG tablet Take 1 tablet (25 mg total) by mouth daily. 30 tablet 11  . ticagrelor (BRILINTA) 90 MG TABS tablet Take 1 tablet (90 mg total) by mouth 2 (two) times daily. 60 tablet 11   No current facility-administered medications for this visit.    Past Medical History  Diagnosis Date  . Essential hypertension, benign   . Anemia     Postoperative; hemoglobin of 11.5 in 04/2011  . LVH (left ventricular hypertrophy)     HOCM physiology  . Anxiety   . Hyperlipidemia   . Arthritis   . Palpitations   . Coronary atherosclerosis of native coronary artery     a. 03/31/14 Ant STEMI s/p DES to LAD; 40-50% diagonal 2 sten and 40% LAD sten  . Ischemic cardiomyopathy     a. 03/31/14 EF 15-20% w/ antersept/apical akinesis- discharged  with life vest    Social History [name] reports that she has never smoked. She has never used smokeless tobacco. [name] reports that she does not drink alcohol.  Review of Systems Complete review of systems negative except as otherwise outlined in the clinical summary.  Physical Examination Filed Vitals:   08/03/14 1549  BP: 142/80  Pulse: 83   Filed Weights   08/03/14 1549  Weight: 104 lb (47.174 kg)    Elderly woman, appears comfortable at rest, wearing Lifevest ICD. HEENT: Conjunctiva and lids normal, oropharynx clear. Neck: Supple, no elevated JVP or carotid  bruits, no thyromegaly. Lungs: Clear to auscultation, nonlabored breathing at rest. Cardiac: Regular rate and rhythm, no S3, soft systolic murmur, no pericardial rub. Abdomen: Soft, nontender, bowel sounds present. Extremities: No pitting edema, distal pulses 2+. Skin: Warm and dry. Resolving ecchymosis on the right leg. Musculoskeletal: No kyphosis. Neuropsychiatric: Alert and oriented x3, affect grossly appropriate.   Problem List and Plan   Coronary atherosclerosis of native coronary artery Symptomatically stable on medical therapy. She will be graduating from cardiac rehabilitation soon, seems to be focusing on continuing a regular exercise regimen and ADLs at home. No changes made in current medications. She continues on DAPT.  Ischemic cardiomyopathy LVEF 20-25% by recent echocardiogram, still technically in a range to consider prophylactic ICD as noted above. Following discussion, she will continue to wear her Lifevest ICD pending formal EP consultation to reach a final decision.  Essential hypertension, benign We will keep an eye on blood pressure to see if any further medication adjustments are needed. Generally this has been reasonably controlled.  Hyperlipidemia She continues on Lipitor, LDL aggressively controlled.    Jonelle SidleSamuel G. Camran Keady, M.D., F.A.C.C.

## 2014-08-03 NOTE — Assessment & Plan Note (Signed)
She continues on Lipitor, LDL aggressively controlled.

## 2014-08-03 NOTE — Assessment & Plan Note (Signed)
Symptomatically stable on medical therapy. She will be graduating from cardiac rehabilitation soon, seems to be focusing on continuing a regular exercise regimen and ADLs at home. No changes made in current medications. She continues on DAPT.

## 2014-08-03 NOTE — Patient Instructions (Signed)
Your physician recommends that you schedule a follow-up appointment in: 3 months with Dr.McDowell   Your physician recommends that you continue on your current medications as directed. Please refer to the Current Medication list given to you today.    We are making you an apt to see the electrophysiologist as soon as possible for Consultation for ICD placement      Thank you for choosing [facility] !

## 2014-08-03 NOTE — Assessment & Plan Note (Signed)
LVEF 20-25% by recent echocardiogram, still technically in a range to consider prophylactic ICD as noted above. Following discussion, she will continue to wear her Lifevest ICD pending formal EP consultation to reach a final decision.

## 2014-08-04 ENCOUNTER — Encounter (HOSPITAL_COMMUNITY)
Admission: RE | Admit: 2014-08-04 | Discharge: 2014-08-04 | Disposition: A | Discharge status: Home / Self Care | Payer: Medicare Other | Source: Ambulatory Visit | Attending: Cardiology | Admitting: Cardiology

## 2014-08-04 DIAGNOSIS — I2510 Atherosclerotic heart disease of native coronary artery without angina pectoris: Secondary | ICD-10-CM | POA: Diagnosis not present

## 2014-08-06 ENCOUNTER — Ambulatory Visit: Payer: Medicare Other | Admitting: Cardiology

## 2014-08-06 ENCOUNTER — Encounter (HOSPITAL_COMMUNITY)
Admission: RE | Admit: 2014-08-06 | Discharge: 2014-08-06 | Disposition: A | Discharge status: Home / Self Care | Payer: Medicare Other | Source: Ambulatory Visit | Attending: Cardiology | Admitting: Cardiology

## 2014-08-06 DIAGNOSIS — I2510 Atherosclerotic heart disease of native coronary artery without angina pectoris: Secondary | ICD-10-CM | POA: Diagnosis not present

## 2014-08-06 NOTE — Progress Notes (Signed)
Cardiac Rehabilitation Program Outcomes Report   Orientation:  05/04/14 Graduate Date:  08/06/14 Discharge Date:  08/06/14 # of sessions completed: 36  Cardiologist: Hermina StaggersMcDowell Family MD:  Dwana MelenaZack Hall Class Time:  0915  A.  Exercise Program:  Total exercise mets is 3.13 for 15 minutes. Post walk test was 2.76 mets.   B.  Mental Health:  Good mental attitude  C.  Education/Instruction/Skills  Accurately checks own pulse.  Rest:  81  Exercise:  93, Knows THR for exercise and Uses Perceived Exertion Scale and/or Dyspnea Scale  Home exercise given: To join [employer] or consider maintenance program.  Exercise Prescription given by EP. Pt attended all education classes.   D.  Nutrition/Weight Control/Body Composition:  Adherence to prescribed nutrition program: good    E.  Blood Lipids    Lab Results  Component Value Date   CHOL 113 05/26/2014   HDL 59 05/26/2014   LDLCALC 33 05/26/2014   LDLDIRECT 135* 02/02/2014   TRIG 103 05/26/2014   CHOLHDL 1.9 05/26/2014    F.  Lifestyle Changes:  Making positive lifestyle changes  G.  Symptoms noted with exercise:  Fatigue,  Patient had some fatigue on occasional sessions that got better with rest.   Report Completed By:  Doretha Sou Manila Rommel RN   Comments:  Patient has graduated Cardiac Rehab today with 36 sessions. Pt has done very well in the program and patient stated she has noticed a difference in her energy/stamina level since starting program.

## 2014-08-18 ENCOUNTER — Encounter: Payer: Self-pay | Admitting: Internal Medicine

## 2014-08-18 ENCOUNTER — Ambulatory Visit (INDEPENDENT_AMBULATORY_CARE_PROVIDER_SITE_OTHER): Payer: Medicare Other | Admitting: Internal Medicine

## 2014-08-18 VITALS — BP 124/82 | HR 81 | Ht 61.0 in | Wt 103.1 lb

## 2014-08-18 DIAGNOSIS — I255 Ischemic cardiomyopathy: Secondary | ICD-10-CM

## 2014-08-18 DIAGNOSIS — I2510 Atherosclerotic heart disease of native coronary artery without angina pectoris: Secondary | ICD-10-CM

## 2014-08-18 NOTE — Patient Instructions (Signed)
Your physician recommends that you schedule a follow-up appointment as needed with Dr Allred   
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
## 2014-08-24 NOTE — Addendum Note (Signed)
Encounter addended by: Andrey Cotahristy M Dekisha Mesmer, RN on: 08/24/2014  2:24 PM<BR>     Documentation filed: Notes Section

## 2014-08-24 NOTE — Progress Notes (Signed)
Patient is discharged from [facility] today, with 36 sessions.  She achieved LTG of 30 minutes of aerobic exercise at max met level of 3.13.  All patient vitals are WNL.  Patient has met with dietician.  Discharge instructions have been reviewed in detail and patient expressed an understanding of material given.  Patient plans to exercise at home.  Cardiac Rehab will make 1 month, 6 month and 1 year call backs.  Patient had no complaints of any abnormal S/S or pain on their exit visit.

## 2014-08-26 ENCOUNTER — Encounter (HOSPITAL_COMMUNITY): Payer: Self-pay | Admitting: Cardiovascular Disease

## 2014-09-01 ENCOUNTER — Encounter (HOSPITAL_COMMUNITY)
Admission: RE | Admit: 2014-09-01 | Discharge: 2014-09-01 | Disposition: A | Discharge status: Home / Self Care | Payer: Self-pay | Source: Ambulatory Visit | Attending: Cardiology | Admitting: Cardiology

## 2014-09-01 DIAGNOSIS — Z955 Presence of coronary angioplasty implant and graft: Secondary | ICD-10-CM | POA: Insufficient documentation

## 2014-09-01 DIAGNOSIS — I252 Old myocardial infarction: Secondary | ICD-10-CM | POA: Insufficient documentation

## 2014-09-03 ENCOUNTER — Encounter (HOSPITAL_COMMUNITY)
Admission: RE | Admit: 2014-09-03 | Discharge: 2014-09-03 | Disposition: A | Discharge status: Home / Self Care | Payer: Self-pay | Source: Ambulatory Visit | Attending: Cardiology | Admitting: Cardiology

## 2014-09-06 ENCOUNTER — Encounter (HOSPITAL_COMMUNITY): Payer: Self-pay

## 2014-09-08 ENCOUNTER — Encounter (HOSPITAL_COMMUNITY)
Admission: RE | Admit: 2014-09-08 | Discharge: 2014-09-08 | Disposition: A | Discharge status: Home / Self Care | Payer: Self-pay | Source: Ambulatory Visit | Attending: Cardiology | Admitting: Cardiology

## 2014-09-10 ENCOUNTER — Encounter (HOSPITAL_COMMUNITY): Payer: Self-pay

## 2014-09-13 ENCOUNTER — Encounter (HOSPITAL_COMMUNITY)
Admission: RE | Admit: 2014-09-13 | Discharge: 2014-09-13 | Disposition: A | Discharge status: Home / Self Care | Payer: Self-pay | Source: Ambulatory Visit | Attending: Cardiology | Admitting: Cardiology

## 2014-09-15 ENCOUNTER — Encounter (HOSPITAL_COMMUNITY)
Admission: RE | Admit: 2014-09-15 | Discharge: 2014-09-15 | Disposition: A | Discharge status: Home / Self Care | Payer: Self-pay | Source: Ambulatory Visit | Attending: Cardiology | Admitting: Cardiology

## 2014-09-17 ENCOUNTER — Encounter (HOSPITAL_COMMUNITY): Payer: Self-pay

## 2014-09-20 ENCOUNTER — Encounter (HOSPITAL_COMMUNITY)
Admission: RE | Admit: 2014-09-20 | Discharge: 2014-09-20 | Disposition: A | Discharge status: Home / Self Care | Payer: Self-pay | Source: Ambulatory Visit | Attending: Cardiology | Admitting: Cardiology

## 2014-09-20 DIAGNOSIS — I252 Old myocardial infarction: Secondary | ICD-10-CM | POA: Insufficient documentation

## 2014-09-20 DIAGNOSIS — Z955 Presence of coronary angioplasty implant and graft: Secondary | ICD-10-CM | POA: Insufficient documentation

## 2014-09-22 ENCOUNTER — Encounter (HOSPITAL_COMMUNITY)
Admission: RE | Admit: 2014-09-22 | Discharge: 2014-09-22 | Disposition: A | Discharge status: Home / Self Care | Payer: Self-pay | Source: Ambulatory Visit | Attending: Cardiology | Admitting: Cardiology

## 2014-09-23 ENCOUNTER — Telehealth: Payer: Self-pay | Admitting: Cardiology

## 2014-09-23 NOTE — Telephone Encounter (Signed)
Noted. LPN will follow up

## 2014-09-23 NOTE — Telephone Encounter (Signed)
Patient states she is going to call Dr. Margo AyeHall to make an appt. Please advise.

## 2014-09-23 NOTE — Telephone Encounter (Signed)
Patient states that she is on blood thinner and has hit her elbow.  States there is a lot of bruising and wants to know if she needs to come up here and be seen. / tgs

## 2014-09-23 NOTE — Telephone Encounter (Signed)
Noted. Agree she should have it looked at by Dr. Margo AyeHall. She is on aspirin and Brilinta with history of DES to the LAD in July 2015. Optimally, would try not to interrupt treatment unless there is significant concern about bleeding.

## 2014-09-24 ENCOUNTER — Encounter (HOSPITAL_COMMUNITY): Payer: Self-pay

## 2014-09-24 ENCOUNTER — Telehealth: Payer: Self-pay

## 2014-09-24 NOTE — Telephone Encounter (Signed)
Pt walked in escorted by cardiac rehab to evaluate swelling and bruising in left hand,forearm and elbow.Takes Brilinta .States she saw Dr Margo AyeHall and was told her medications need to be adjusted.I noted loud,strong radial and brachial pulses by palp and doppler.I had her remove her wedding bands and encouraged her to put ice on areas.She believes she jumped up from sofa and pushed off with her arm to get up.Apt made for 1// with Dr.McDowell    Dr.Koneswaran assessed and concurred with plan

## 2014-09-27 ENCOUNTER — Encounter (HOSPITAL_COMMUNITY): Payer: Self-pay

## 2014-09-27 ENCOUNTER — Encounter: Payer: Self-pay | Admitting: Cardiology

## 2014-09-27 ENCOUNTER — Ambulatory Visit (INDEPENDENT_AMBULATORY_CARE_PROVIDER_SITE_OTHER): Payer: Self-pay | Admitting: Cardiology

## 2014-09-27 VITALS — BP 116/58 | HR 88 | Ht 60.0 in | Wt 104.0 lb

## 2014-09-27 DIAGNOSIS — I2510 Atherosclerotic heart disease of native coronary artery without angina pectoris: Secondary | ICD-10-CM

## 2014-09-27 DIAGNOSIS — I255 Ischemic cardiomyopathy: Secondary | ICD-10-CM

## 2014-09-27 DIAGNOSIS — S40022D Contusion of left upper arm, subsequent encounter: Secondary | ICD-10-CM

## 2014-09-27 DIAGNOSIS — S40022A Contusion of left upper arm, initial encounter: Secondary | ICD-10-CM | POA: Insufficient documentation

## 2014-09-27 MED ORDER — CLOPIDOGREL BISULFATE 75 MG PO TABS: 75.0000 mg | ORAL_TABLET | Freq: Every day | ORAL | Status: DC

## 2014-09-27 NOTE — Assessment & Plan Note (Signed)
LVEF 20-25%. As per above discussion, patient has declined placement of ICD. She saw Dr. Johney FrameAllred in consultation.

## 2014-09-27 NOTE — Assessment & Plan Note (Signed)
Patient to get a proper sling to elevate her arm.  She has taken rings off her left hand already. Ecchymosis looks to be resolving based on appearance, although she still has residual edema in her hand and elbow. In light of this and also other easy bruising on DAPT, we will stop Brilinta and convert to Plavix instead. Hopefully she will be able to tolerate this better, and we will still be able to continue DAPT in light of DES to the LAD in July 2015. Ideally we would continue a full 1 year course.

## 2014-09-27 NOTE — Assessment & Plan Note (Signed)
No angina symptoms. Has joined the maintenance cardiac rehabilitation program. Continue medical therapy including DAPT in the form of aspirin and Plavix at this point.

## 2014-09-27 NOTE — Progress Notes (Signed)
Reason for visit:  CAD, cardiomyopathy, left arm swelling and ecchymosis  Clinical Summary Ms. [name] is an 81 y.o.female last seen in November 2015.  She comes in today for a follow-up visit. I reviewed the telephone notes. She has had trouble with a significant amount of ecchymosis involving her left arm, started around her elbow, is now up into her biceps area and down into her hand with associated edema. She states that this is not painful. She has no tingling or numbness in her hands. Also has good radial pulse on examination. She states that she thinks this may have occurred by leaning on her left arm or injuring it in some way when she pushes herself up from the couch.  She denies any fevers or chills.  She has been evaluated by her primary care physician Dr. Margo AyeHall.  She is continued on both aspirin and Brilinta following ACS with DES to the LAD in July 2015.  Follow-up echocardiogram in late October revealed basal LV septal hypertrophy with LVEF 20-25%, grade 1 diastolic dysfunction, and wall motion abnormalities consistent with ischemic cardiomyopathy.  She wore a Lifevest defibrillator following hospital disharge with prior ACS, and was seen by Dr. Johney FrameAllred in December 2015.  They had a thorough discussion at that time on known review, and decision was not to pursue implantation of an ICD. She denies any palpitations or syncope.  Allergies  Allergen Reactions  . Alprazolam Other (See Comments)    "puts me to sleep for days"  . Caffeine Other (See Comments)    "peps me up"    Current Outpatient Prescriptions  Medication Sig Dispense Refill  . aspirin EC 81 MG EC tablet Take 1 tablet (81 mg total) by mouth daily.    Marland Kitchen. atorvastatin (LIPITOR) 20 MG tablet Take 1 tablet (20 mg total) by mouth daily at 6 PM. 30 tablet 11  . carvedilol (COREG) 6.25 MG tablet Take 1 tablet (6.25 mg total) by mouth 2 (two) times daily with a meal. 60 tablet 11  . lisinopril (PRINIVIL,ZESTRIL) 2.5 MG tablet Take  1 tablet (2.5 mg total) by mouth daily. 30 tablet 11  . Multiple Vitamin (MULTIVITAMIN WITH MINERALS) TABS Take 1 tablet by mouth daily.    . nitroGLYCERIN (NITROSTAT) 0.4 MG SL tablet Place 1 tablet (0.4 mg total) under the tongue every 5 (five) minutes x 3 doses as needed for chest pain. 25 tablet 12  . pantoprazole (PROTONIX) 40 MG tablet Take 1 tablet (40 mg total) by mouth daily. 30 tablet 11  . sertraline (ZOLOFT) 25 MG tablet Take 1 tablet (25 mg total) by mouth daily. 30 tablet 11  . clopidogrel (PLAVIX) 75 MG tablet Take 1 tablet (75 mg total) by mouth daily. 90 tablet 3   No current facility-administered medications for this visit.    Past Medical History  Diagnosis Date  . Essential hypertension, benign   . Anemia     Postoperative; hemoglobin of 11.5 in 04/2011  . LVH (left ventricular hypertrophy)     HOCM physiology  . Anxiety   . Hyperlipidemia   . Arthritis   . Palpitations   . Coronary atherosclerosis of native coronary artery     a. 03/31/14 Ant STEMI s/p DES to LAD; 40-50% diagonal 2 sten and 40% LAD sten  . Ischemic cardiomyopathy     a. 03/31/14 EF 15-20% w/ antersept/apical akinesis- discharged with life vest    Social History Ms. [name] reports that she has never smoked. She has never  used smokeless tobacco. Ms. Tolle reports that she does not drink alcohol.  Review of Systems Complete review of systems negative except as otherwise outlined in the clinical summary and also the following.  She has started the maintenance cardiac debilitation program.  Physical Examination Filed Vitals:   09/27/14 1615  BP: 116/58  Pulse: 88   Filed Weights   09/27/14 1615  Weight: 104 lb (47.174 kg)   Elderly woman, appears comfortable at rest. HEENT: Conjunctiva and lids normal, oropharynx clear. Neck: Supple, no elevated JVP or carotid bruits, no thyromegaly. Lungs: Clear to auscultation, nonlabored breathing at rest. Cardiac: Regular rate and rhythm, no S3, soft  systolic murmur, no pericardial rub. Abdomen: Soft, nontender, bowel sounds present. Extremities:  Has been wearing left arm in sling. There is extensive ecchymosis that appears to be resolving from the mid biceps area down into the hand with associated edema particularly of the hand and fingers. Radial pulses normal. She denies any pain on palpation, no unusual warmth, no tingling or numbness in her hands., distal pulses 2+.   Problem List and Plan   Traumatic ecchymosis of left upper arm Patient to get a proper sling to elevate her arm.  She has taken rings off her left hand already. Ecchymosis looks to be resolving based on appearance, although she still has residual edema in her hand and elbow. In light of this and also other easy bruising on DAPT, we will stop Brilinta and convert to Plavix instead. Hopefully she will be able to tolerate this better, and we will still be able to continue DAPT in light of DES to the LAD in July 2015. Ideally we would continue a full 1 year course.  Coronary atherosclerosis of native coronary artery No angina symptoms. Has joined the maintenance cardiac rehabilitation program. Continue medical therapy including DAPT in the form of aspirin and Plavix at this point.  Ischemic cardiomyopathy LVEF 20-25%. As per above discussion, patient has declined placement of ICD. She saw Dr. Johney Frame in consultation.    Jonelle Sidle, M.D., F.A.C.C.

## 2014-09-27 NOTE — Patient Instructions (Signed)
Your physician recommends that you schedule a follow-up appointment in: 1 month with Dr.McDowell      STOP BRILINTA    START Plavix 75 mg daily      Thank you for choosing [facility] !

## 2014-09-29 ENCOUNTER — Encounter (HOSPITAL_COMMUNITY): Payer: Self-pay

## 2014-10-01 ENCOUNTER — Encounter (HOSPITAL_COMMUNITY): Payer: Self-pay

## 2014-10-04 ENCOUNTER — Encounter (HOSPITAL_COMMUNITY)
Admission: RE | Admit: 2014-10-04 | Discharge: 2014-10-04 | Disposition: A | Discharge status: Home / Self Care | Payer: Self-pay | Source: Ambulatory Visit | Attending: Cardiology | Admitting: Cardiology

## 2014-10-06 ENCOUNTER — Encounter (HOSPITAL_COMMUNITY): Payer: Self-pay

## 2014-10-08 ENCOUNTER — Encounter (HOSPITAL_COMMUNITY): Payer: Self-pay

## 2014-10-11 ENCOUNTER — Encounter (HOSPITAL_COMMUNITY): Payer: Self-pay

## 2014-10-13 ENCOUNTER — Encounter (HOSPITAL_COMMUNITY): Payer: Self-pay

## 2014-10-15 ENCOUNTER — Encounter (HOSPITAL_COMMUNITY)
Admission: RE | Admit: 2014-10-15 | Discharge: 2014-10-15 | Disposition: A | Discharge status: Home / Self Care | Payer: Self-pay | Source: Ambulatory Visit | Attending: Cardiology | Admitting: Cardiology

## 2014-10-18 ENCOUNTER — Encounter (HOSPITAL_COMMUNITY)
Admission: RE | Admit: 2014-10-18 | Discharge: 2014-10-18 | Disposition: A | Discharge status: Home / Self Care | Payer: Self-pay | Source: Ambulatory Visit | Attending: Cardiology | Admitting: Cardiology

## 2014-10-18 DIAGNOSIS — I252 Old myocardial infarction: Secondary | ICD-10-CM | POA: Insufficient documentation

## 2014-10-18 DIAGNOSIS — Z955 Presence of coronary angioplasty implant and graft: Secondary | ICD-10-CM | POA: Insufficient documentation

## 2014-10-20 ENCOUNTER — Encounter (HOSPITAL_COMMUNITY): Payer: Self-pay

## 2014-10-22 ENCOUNTER — Encounter (HOSPITAL_COMMUNITY): Payer: Self-pay

## 2014-10-25 ENCOUNTER — Encounter (HOSPITAL_COMMUNITY): Payer: Self-pay

## 2014-10-25 IMAGING — CR DG CHEST 1V PORT
1 series · 1 of 1 positions shown · non-contrast
Comparison: Portable chest x-ray March 30, 2014

CLINICAL DATA: Shortness of breath, CHF

EXAM:
PORTABLE CHEST - 1 VIEW

[AP]
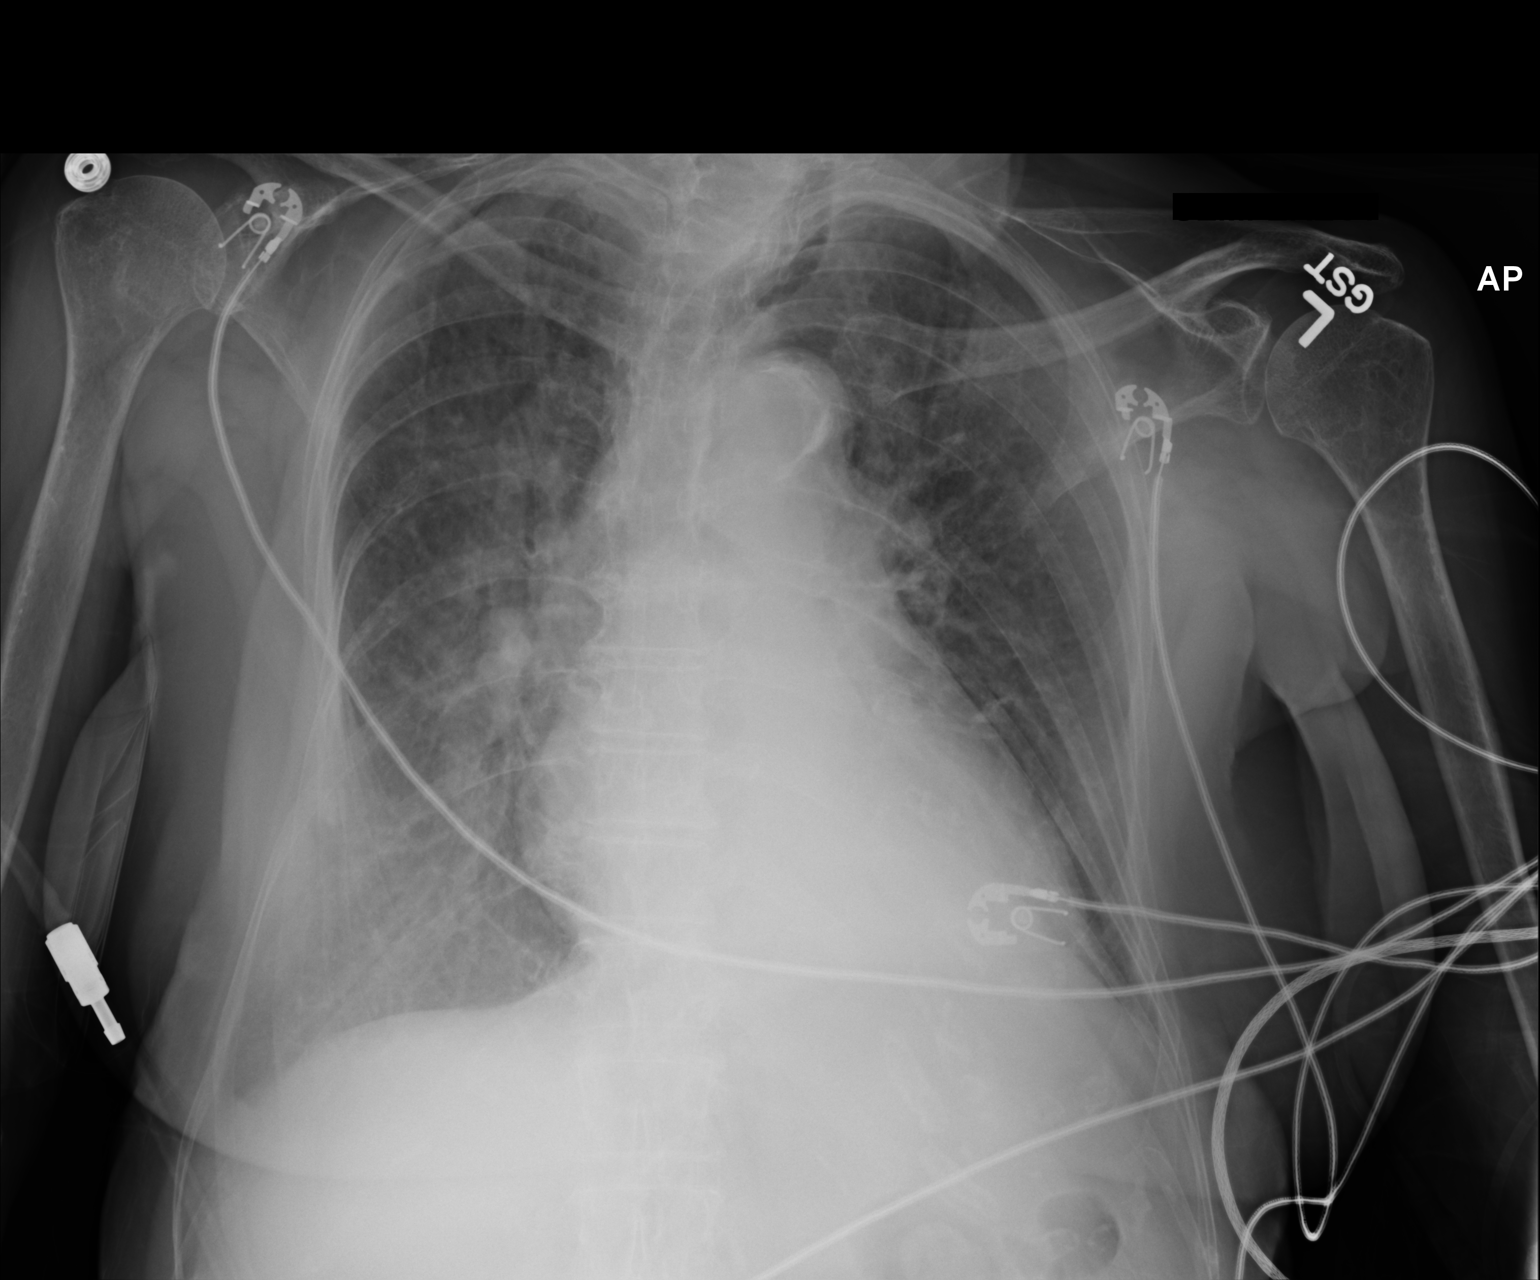

[1 of 1 positions shown; findings below may reference images not displayed]

FINDINGS: The lungs are well-expanded. The interstitial markings are less
conspicuous today. The retrocardiac region on the left remains dense
and partial obscuration of the left hemidiaphragm persists. The
cardiac silhouette is mildly enlarged. The pulmonary vascularity
remains engorged but is more distinct. The bones are osteopenic.
IMPRESSION: Improving CHF superimposed upon COPD. Left lower lobe atelectasis or
infiltrate persists but is slightly less conspicuous.

## 2014-10-27 ENCOUNTER — Encounter (HOSPITAL_COMMUNITY): Payer: Self-pay

## 2014-11-03 ENCOUNTER — Encounter: Payer: Self-pay | Admitting: Cardiology

## 2014-11-03 ENCOUNTER — Ambulatory Visit (INDEPENDENT_AMBULATORY_CARE_PROVIDER_SITE_OTHER): Payer: Medicare Other | Admitting: Cardiology

## 2014-11-03 ENCOUNTER — Encounter (HOSPITAL_COMMUNITY)
Admission: RE | Admit: 2014-11-03 | Discharge: 2014-11-03 | Disposition: A | Discharge status: Home / Self Care | Payer: Self-pay | Source: Ambulatory Visit | Attending: Cardiology | Admitting: Cardiology

## 2014-11-03 ENCOUNTER — Ambulatory Visit: Payer: Medicare Other | Admitting: Cardiology

## 2014-11-03 VITALS — BP 104/68 | HR 89 | Ht 61.0 in | Wt 106.0 lb

## 2014-11-03 DIAGNOSIS — I255 Ischemic cardiomyopathy: Secondary | ICD-10-CM

## 2014-11-03 DIAGNOSIS — E782 Mixed hyperlipidemia: Secondary | ICD-10-CM

## 2014-11-03 DIAGNOSIS — I2510 Atherosclerotic heart disease of native coronary artery without angina pectoris: Secondary | ICD-10-CM

## 2014-11-03 DIAGNOSIS — I10 Essential (primary) hypertension: Secondary | ICD-10-CM

## 2014-11-03 NOTE — Patient Instructions (Signed)
Your physician recommends that you schedule a follow-up appointment in: 4 months with Dr. McDowell  Your physician recommends that you continue on your current medications as directed. Please refer to the Current Medication list given to you today.  Thank you for choosing [facility]!    
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
## 2014-11-05 ENCOUNTER — Encounter (HOSPITAL_COMMUNITY)
Admission: RE | Admit: 2014-11-05 | Discharge: 2014-11-05 | Disposition: A | Discharge status: Home / Self Care | Payer: Self-pay | Source: Ambulatory Visit | Attending: Cardiology | Admitting: Cardiology

## 2014-11-08 ENCOUNTER — Encounter (HOSPITAL_COMMUNITY)
Admission: RE | Admit: 2014-11-08 | Discharge: 2014-11-08 | Disposition: A | Discharge status: Home / Self Care | Payer: Self-pay | Source: Ambulatory Visit | Attending: Cardiology | Admitting: Cardiology

## 2014-11-10 ENCOUNTER — Encounter (HOSPITAL_COMMUNITY)
Admission: RE | Admit: 2014-11-10 | Discharge: 2014-11-10 | Disposition: A | Discharge status: Home / Self Care | Payer: Self-pay | Source: Ambulatory Visit | Attending: Cardiology | Admitting: Cardiology

## 2014-11-12 ENCOUNTER — Encounter (HOSPITAL_COMMUNITY)
Admission: RE | Admit: 2014-11-12 | Discharge: 2014-11-12 | Disposition: A | Discharge status: Home / Self Care | Payer: Self-pay | Source: Ambulatory Visit | Attending: Cardiology | Admitting: Cardiology

## 2014-11-15 ENCOUNTER — Encounter (HOSPITAL_COMMUNITY)
Admission: RE | Admit: 2014-11-15 | Discharge: 2014-11-15 | Disposition: A | Discharge status: Home / Self Care | Payer: Self-pay | Source: Ambulatory Visit | Attending: Cardiology | Admitting: Cardiology

## 2014-11-17 ENCOUNTER — Encounter (HOSPITAL_COMMUNITY)
Admission: RE | Admit: 2014-11-17 | Discharge: 2014-11-17 | Disposition: A | Discharge status: Home / Self Care | Payer: Self-pay | Source: Ambulatory Visit | Attending: Cardiology | Admitting: Cardiology

## 2014-11-17 DIAGNOSIS — Z955 Presence of coronary angioplasty implant and graft: Secondary | ICD-10-CM | POA: Insufficient documentation

## 2014-11-17 DIAGNOSIS — I252 Old myocardial infarction: Secondary | ICD-10-CM | POA: Insufficient documentation

## 2014-11-19 ENCOUNTER — Encounter (HOSPITAL_COMMUNITY)
Admission: RE | Admit: 2014-11-19 | Discharge: 2014-11-19 | Disposition: A | Discharge status: Home / Self Care | Payer: Self-pay | Source: Ambulatory Visit | Attending: Cardiology | Admitting: Cardiology

## 2014-11-22 ENCOUNTER — Encounter (HOSPITAL_COMMUNITY)
Admission: RE | Admit: 2014-11-22 | Discharge: 2014-11-22 | Disposition: A | Discharge status: Home / Self Care | Payer: Self-pay | Source: Ambulatory Visit | Attending: Cardiology | Admitting: Cardiology

## 2014-11-24 ENCOUNTER — Encounter (HOSPITAL_COMMUNITY)
Admission: RE | Admit: 2014-11-24 | Discharge: 2014-11-24 | Disposition: A | Discharge status: Home / Self Care | Payer: Self-pay | Source: Ambulatory Visit | Attending: Cardiology | Admitting: Cardiology

## 2014-11-26 ENCOUNTER — Encounter (HOSPITAL_COMMUNITY)
Admission: RE | Admit: 2014-11-26 | Discharge: 2014-11-26 | Disposition: A | Discharge status: Home / Self Care | Payer: Self-pay | Source: Ambulatory Visit | Attending: Cardiology | Admitting: Cardiology

## 2014-11-29 ENCOUNTER — Encounter (HOSPITAL_COMMUNITY)
Admission: RE | Admit: 2014-11-29 | Discharge: 2014-11-29 | Disposition: A | Discharge status: Home / Self Care | Payer: Self-pay | Source: Ambulatory Visit | Attending: Cardiology | Admitting: Cardiology

## 2014-12-01 ENCOUNTER — Encounter (HOSPITAL_COMMUNITY)
Admission: RE | Admit: 2014-12-01 | Discharge: 2014-12-01 | Disposition: A | Discharge status: Home / Self Care | Payer: Self-pay | Source: Ambulatory Visit | Attending: Cardiology | Admitting: Cardiology

## 2014-12-03 ENCOUNTER — Encounter (HOSPITAL_COMMUNITY): Payer: Self-pay

## 2014-12-06 ENCOUNTER — Encounter (HOSPITAL_COMMUNITY): Payer: Self-pay

## 2014-12-08 ENCOUNTER — Encounter (HOSPITAL_COMMUNITY): Payer: Self-pay

## 2014-12-10 ENCOUNTER — Encounter (HOSPITAL_COMMUNITY): Payer: Self-pay

## 2014-12-13 ENCOUNTER — Encounter (HOSPITAL_COMMUNITY): Payer: Self-pay

## 2014-12-15 ENCOUNTER — Encounter (HOSPITAL_COMMUNITY): Payer: Self-pay

## 2014-12-17 ENCOUNTER — Encounter (HOSPITAL_COMMUNITY): Payer: Self-pay

## 2014-12-20 ENCOUNTER — Encounter (HOSPITAL_COMMUNITY): Payer: Self-pay

## 2014-12-22 ENCOUNTER — Encounter (HOSPITAL_COMMUNITY): Payer: Self-pay

## 2014-12-24 ENCOUNTER — Encounter (HOSPITAL_COMMUNITY): Payer: Self-pay

## 2014-12-27 ENCOUNTER — Encounter (HOSPITAL_COMMUNITY): Payer: Self-pay

## 2015-01-12 ENCOUNTER — Ambulatory Visit (INDEPENDENT_AMBULATORY_CARE_PROVIDER_SITE_OTHER): Payer: Medicare Other | Admitting: Orthopedic Surgery

## 2015-01-12 ENCOUNTER — Ambulatory Visit (INDEPENDENT_AMBULATORY_CARE_PROVIDER_SITE_OTHER): Payer: Medicare Other

## 2015-01-12 ENCOUNTER — Encounter: Payer: Self-pay | Admitting: Orthopedic Surgery

## 2015-01-12 VITALS — BP 152/82 | Ht 61.0 in | Wt 108.8 lb

## 2015-01-12 DIAGNOSIS — M25421 Effusion, right elbow: Secondary | ICD-10-CM

## 2015-01-12 DIAGNOSIS — M25521 Pain in right elbow: Secondary | ICD-10-CM

## 2015-01-12 NOTE — Patient Instructions (Signed)
We will contact cardiologist about Plavix and clearance for surgery and call you to arrange date
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
## 2015-01-14 ENCOUNTER — Telehealth: Payer: Self-pay | Admitting: *Deleted

## 2015-01-14 NOTE — Telephone Encounter (Signed)
Advised patient that Dr Romeo AppleHarrison spoke with cardiology regarding surgery and coming off her Plavix and was advised not to proceed until September

## 2015-03-02 ENCOUNTER — Ambulatory Visit (INDEPENDENT_AMBULATORY_CARE_PROVIDER_SITE_OTHER): Payer: Medicare Other | Admitting: Cardiology

## 2015-03-02 ENCOUNTER — Encounter: Payer: Self-pay | Admitting: Cardiology

## 2015-03-02 VITALS — BP 138/84 | HR 86 | Ht 61.0 in | Wt 108.0 lb

## 2015-03-02 DIAGNOSIS — E785 Hyperlipidemia, unspecified: Secondary | ICD-10-CM

## 2015-03-02 DIAGNOSIS — I10 Essential (primary) hypertension: Secondary | ICD-10-CM

## 2015-03-02 DIAGNOSIS — I255 Ischemic cardiomyopathy: Secondary | ICD-10-CM | POA: Diagnosis not present

## 2015-03-02 DIAGNOSIS — I2510 Atherosclerotic heart disease of native coronary artery without angina pectoris: Secondary | ICD-10-CM

## 2015-03-02 NOTE — Progress Notes (Signed)
Cardiology Office Note  Date: 03/02/2015   ID: [name], [name] 04-13-1928, MRN [id number]  PCP: Catalina Pizza, MD  Primary Cardiologist: Nona Dell, MD   Chief Complaint  Patient presents with  . Coronary Artery Disease  . Cardiomyopathy    History of Present Illness: [name] is an 81 y.o. female last seen in February. She presents for a routine follow-up visit. She has completed cardiac rehabilitation, does some basic exercise on her own, also independently functional in her ADLs. She states that she recently got a pedometer from someone at her church, although has not been able to figure out how to use it.  She is not reporting any angina symptoms on medical therapy. Typically has NYHA class II dyspnea. No palpitations or syncope. Follow-up ECGs reviewed below.  Follow-up echocardiogram in late October revealed basal LV septal hypertrophy with LVEF 20-25%, grade 1 diastolic dysfunction, and wall motion abnormalities consistent with ischemic cardiomyopathy. She wore a Lifevest defibrillator following hospital disharge with prior ACS, and was seen by Dr. Johney Frame in December 2015. They had a thorough discussion at that time, and decision was not to pursue implantation of an ICD.  She has a large, swollen right olecranon bursa and would like to have it resected. Dr. Romeo Apple has evaluated her for this. She would be able to stop Plavix by the end of this July.  Past Medical History  Diagnosis Date  . Essential hypertension, benign   . Anemia     Postoperative; hemoglobin of 11.5 in 04/2011  . LVH (left ventricular hypertrophy)     HOCM physiology  . Anxiety   . Hyperlipidemia   . Arthritis   . Palpitations   . Coronary atherosclerosis of native coronary artery     a. 03/31/14 Ant STEMI s/p DES to LAD; 40-50% diagonal 2 sten and 40% LAD sten  . Ischemic cardiomyopathy     a. 03/31/14 EF 15-20% w/ antersept/apical akinesis     Current Outpatient Prescriptions  Medication  Sig Dispense Refill  . aspirin EC 81 MG EC tablet Take 1 tablet (81 mg total) by mouth daily.    Marland Kitchen atorvastatin (LIPITOR) 20 MG tablet Take 1 tablet (20 mg total) by mouth daily at 6 PM. 30 tablet 11  . carvedilol (COREG) 6.25 MG tablet Take 1 tablet (6.25 mg total) by mouth 2 (two) times daily with a meal. 60 tablet 11  . clopidogrel (PLAVIX) 75 MG tablet Take 1 tablet (75 mg total) by mouth daily. 90 tablet 3  . Multiple Vitamin (MULTIVITAMIN WITH MINERALS) TABS Take 1 tablet by mouth daily.    . nitroGLYCERIN (NITROSTAT) 0.4 MG SL tablet Place 1 tablet (0.4 mg total) under the tongue every 5 (five) minutes x 3 doses as needed for chest pain. 25 tablet 12  . pantoprazole (PROTONIX) 40 MG tablet Take 1 tablet (40 mg total) by mouth daily. 30 tablet 11  . sertraline (ZOLOFT) 25 MG tablet Take 1 tablet (25 mg total) by mouth daily. 30 tablet 11   No current facility-administered medications for this visit.    Allergies:  Alprazolam and Caffeine   Social History: The patient  reports that she has never smoked. She has never used smokeless tobacco. She reports that she does not drink alcohol or use illicit drugs.   ROS:  Please see the history of present illness. Otherwise, complete review of systems is positive for none.  All other systems are reviewed and negative.   Physical Exam:  VS:  BP 138/84 mmHg  Pulse 86  Ht  (1.549 m)  Wt 108 lb (48.988 kg)  BMI 20.42 kg/m2  SpO2 96%, BMI Body mass index is 20.42 kg/(m^2).  Wt Readings from Last 3 Encounters:  03/02/15 108 lb (48.988 kg)  01/12/15 108 lb 12.8 oz (49.351 kg)  11/03/14 106 lb (48.081 kg)     Elderly woman, appears comfortable at rest. HEENT: Conjunctiva and lids normal, oropharynx clear. Neck: Supple, no elevated JVP or carotid bruits, no thyromegaly. Lungs: Clear to auscultation, nonlabored breathing at rest. Cardiac: Regular rate and rhythm, no S3, soft systolic murmur, no pericardial rub. Abdomen: Soft, nontender,  bowel sounds present. Extremities: No leg edema. Large, nontender right olecranon bursa. Musculoskeletal: No kyphosis. Neuropsychiatric: Alert and oriented 3, limited range of motion left fingers.   ECG: ECG is ordered today and shows sinus rhythm with evidence of previous anterior infarct with residual ST-T wave abnormalities have improved.  Recent Labwork:  12/24/2014: Potassium 4.8, BUN 25, creatinine 0.7, AST 16, ALT 18, hemoglobin 14.4, platelets 218, cholesterol 136, triglycerides 74, HDL 61, LDL 60  ASSESSMENT AND PLAN:  1. CAD status post anterior STEMI in July of last year with DES to the LAD. She is doing well with no active angina symptoms on medical therapy. She will be able to come off Plavix in August. Otherwise continue current regimen.  2. Ischemic cardiomyopathy with LVEF 20-25%. She has declined ICD as discussed previously, and we will continue medical therapy.  3. Hyperlipidemia, much better controlled with recent LDL 60 on Lipitor.  Current medicines were reviewed at length with the patient today.   Orders Placed This Encounter  Procedures  . EKG 12-Lead    Disposition: FU with me in 4 months.   Signed, Jonelle Sidle, MD, [facility] 03/02/2015 2:27 PM    [facility] at [facility] [address] [place], [place] [address] Phone: [phone]; Fax: [phone]

## 2015-03-02 NOTE — Patient Instructions (Signed)
Your physician recommends that you schedule a follow-up appointment in:  4 months with Dr.McDowell  DISCONTINUE PLAVIX  IN  [place]    Thanks for choosing [facility]!!!

## 2015-03-29 ENCOUNTER — Other Ambulatory Visit: Payer: Self-pay

## 2015-03-29 ENCOUNTER — Other Ambulatory Visit: Payer: Self-pay | Admitting: Physician Assistant

## 2015-03-29 DIAGNOSIS — I517 Cardiomegaly: Secondary | ICD-10-CM

## 2015-03-29 DIAGNOSIS — I25119 Atherosclerotic heart disease of native coronary artery with unspecified angina pectoris: Secondary | ICD-10-CM

## 2015-03-29 MED ORDER — CARVEDILOL 6.25 MG PO TABS: 6.2500 mg | ORAL_TABLET | Freq: Two times a day (BID) | ORAL | Status: DC

## 2015-04-19 ENCOUNTER — Telehealth: Payer: Self-pay | Admitting: Cardiology

## 2015-04-23 ENCOUNTER — Encounter: Payer: Self-pay | Admitting: Cardiology

## 2015-04-25 ENCOUNTER — Telehealth: Payer: Self-pay | Admitting: Cardiology

## 2015-04-25 DIAGNOSIS — I25119 Atherosclerotic heart disease of native coronary artery with unspecified angina pectoris: Secondary | ICD-10-CM

## 2015-04-25 DIAGNOSIS — I517 Cardiomegaly: Secondary | ICD-10-CM

## 2015-04-25 MED ORDER — CARVEDILOL 6.25 MG PO TABS: 6.2500 mg | ORAL_TABLET | Freq: Two times a day (BID) | ORAL | Status: DC

## 2015-04-25 NOTE — Telephone Encounter (Signed)
Abelino Derrick CMA went over pt's medications with her today

## 2015-04-25 NOTE — Telephone Encounter (Signed)
Messaged Dr Eden Emms if he wanted to continue refilling Zoloft for pt which he prescribed in hospital 9 months ago.Pt states she doesn't feel she need to continue but daughter was concerned for withdrawal

## 2015-04-25 NOTE — Telephone Encounter (Signed)
Pt would like someone to call her and go over her medications with her

## 2015-04-27 ENCOUNTER — Other Ambulatory Visit: Payer: Self-pay

## 2015-04-27 MED ORDER — SERTRALINE HCL 25 MG PO TABS: 25.0000 mg | ORAL_TABLET | Freq: Every day | ORAL | Status: DC

## 2015-04-27 NOTE — Telephone Encounter (Signed)
Refill complete 

## 2015-05-02 ENCOUNTER — Encounter: Payer: Self-pay | Admitting: *Deleted

## 2015-05-02 MED ORDER — SERTRALINE HCL 25 MG PO TABS: 25.0000 mg | ORAL_TABLET | Freq: Every day | ORAL | Status: DC

## 2015-05-02 NOTE — Telephone Encounter (Signed)
-----   Message from Wendall Stade, MD sent at 05/02/2015 12:15 AM EDT ----- Regarding: FW: refill psych drug Ok to refill for 3 months but then primary should fill  ----- Message -----    From: Nori Riis, RN    Sent: 04/25/2015   8:30 AM      To: Wendall Stade, MD Subject: refill psych drug                              Non-Urgent Medical Question  Message [phone]   From Orlene Erm   To Jonelle Sidle, MD   Sent 04/23/2015 10:52 AM   [facility] can't refill my mother's sertraline prescription, says it has faced paperwork twice. It's 25 mg. K. stern.  Do you want to continue to refill psch medication on this pt ? Sounds like she needs to see someone.  Robbie Lis [phone]. Phone [phone].  She has been on this since July 2015--originally prescribed by Dr. Eden Emms at [facility] when she had her heart attack.   I think the pharmacy gave her 3 pills when the July supply ran out, and she has been taking halves. She wants to quit taking them, I am concerned--I think it has helped her anxiety and I am worried about withdrawal. She told me when she called your office last week, someone told her you all did not prescribe that med for her. She may be confused.  Can you please call her at [phone], get appropriate refill authorization to [facility]?   I see that you have sent email she needs to schedule Oct. appointment. Can you call her for that, please? I don't think she checks her MyChart account unless I do it for her.  Whitney Muse

## 2015-06-29 ENCOUNTER — Other Ambulatory Visit: Payer: Self-pay | Admitting: Cardiology

## 2015-06-29 MED ORDER — ATORVASTATIN CALCIUM 20 MG PO TABS: 20.0000 mg | ORAL_TABLET | Freq: Every day | ORAL | Status: DC

## 2015-07-08 ENCOUNTER — Ambulatory Visit: Payer: Medicare Other | Admitting: Cardiology

## 2015-07-11 ENCOUNTER — Ambulatory Visit (INDEPENDENT_AMBULATORY_CARE_PROVIDER_SITE_OTHER): Payer: Medicare Other | Admitting: Cardiology

## 2015-07-11 ENCOUNTER — Encounter: Payer: Self-pay | Admitting: Cardiology

## 2015-07-11 VITALS — BP 136/84 | HR 76 | Ht 61.0 in | Wt 109.0 lb

## 2015-07-11 DIAGNOSIS — Z23 Encounter for immunization: Secondary | ICD-10-CM | POA: Diagnosis not present

## 2015-07-11 DIAGNOSIS — E785 Hyperlipidemia, unspecified: Secondary | ICD-10-CM

## 2015-07-11 DIAGNOSIS — I255 Ischemic cardiomyopathy: Secondary | ICD-10-CM

## 2015-07-11 DIAGNOSIS — I5022 Chronic systolic (congestive) heart failure: Secondary | ICD-10-CM

## 2015-07-11 DIAGNOSIS — I2510 Atherosclerotic heart disease of native coronary artery without angina pectoris: Secondary | ICD-10-CM

## 2015-07-11 MED ORDER — FUROSEMIDE 20 MG PO TABS: ORAL_TABLET | ORAL | Status: DC
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
## 2015-07-11 NOTE — Patient Instructions (Signed)
Your physician recommends that you schedule a follow-up appointment in: 3 months with Dr Diona BrownerMcDowell  Take Lasix 20 mg daily as needed for abdominal swelling   Please take Lipitor as directed  Please get labs BMET  A few days BEFORE the next visit      If you need a refill on your cardiac medications before your next appointment, please call your pharmacy.    Thank you for choosing [facility] !

## 2015-10-05 ENCOUNTER — Telehealth: Payer: Self-pay | Admitting: Cardiology

## 2015-10-05 NOTE — Telephone Encounter (Signed)
Patient wants to make sure her orders are in for BMET that is due prior to her appointment next Tuesday, 10/11/15.  Please call the patient to verify that she does not need to carry in a hardcopy of the lab orders but she can go to [facility] anytime to get this done.

## 2015-10-05 NOTE — Telephone Encounter (Signed)
Printed off lab slip and mailed to pt. Called her to let her know.

## 2015-10-06 NOTE — Telephone Encounter (Signed)
Patient would like to get labs done Friday, 1/20 but the orders did not come in the mail to her house today.  Can we print another copy of the orders and have them ready for her pick up in our office tomorrow?

## 2015-10-08 LAB — BASIC METABOLIC PANEL
BUN: 20 mg/dL (ref 7–25)
CHLORIDE: 107 mmol/L (ref 98–110)
CO2: 27 mmol/L (ref 20–31)
Calcium: 9.5 mg/dL (ref 8.6–10.4)
Creat: 0.6 mg/dL (ref 0.60–0.88)
Glucose, Bld: 108 mg/dL — ABNORMAL HIGH (ref 65–99)
Potassium: 4.3 mmol/L (ref 3.5–5.3)
SODIUM: 140 mmol/L (ref 135–146)

## 2015-10-11 ENCOUNTER — Encounter: Payer: Self-pay | Admitting: Cardiology

## 2015-10-11 ENCOUNTER — Ambulatory Visit (INDEPENDENT_AMBULATORY_CARE_PROVIDER_SITE_OTHER): Payer: Medicare Other | Admitting: Cardiology

## 2015-10-11 VITALS — BP 122/74 | HR 81 | Ht 61.0 in | Wt 107.4 lb

## 2015-10-11 DIAGNOSIS — I2510 Atherosclerotic heart disease of native coronary artery without angina pectoris: Secondary | ICD-10-CM | POA: Diagnosis not present

## 2015-10-11 DIAGNOSIS — I255 Ischemic cardiomyopathy: Secondary | ICD-10-CM

## 2015-10-11 DIAGNOSIS — I10 Essential (primary) hypertension: Secondary | ICD-10-CM | POA: Diagnosis not present

## 2015-10-11 NOTE — Patient Instructions (Signed)
Your physician wants you to follow-up in: 4 months with Dr McDowell You will receive a reminder letter in the mail two months in advance. If you don't receive a letter, please call our office to schedule the follow-up appointment.    Your physician recommends that you continue on your current medications as directed. Please refer to the Current Medication list given to you today.    If you need a refill on your cardiac medications before your next appointment, please call your pharmacy.     Thank you for choosing [facility] !        

## 2015-10-11 NOTE — Progress Notes (Signed)
Cardiology Office Note  Date: 10/11/2015   ID: Lilianna, Case 1928-01-18, MRN [id number]  PCP: Dwana Melena, MD  Primary Cardiologist: Nona Dell, MD   Chief Complaint  Patient presents with  . Coronary Artery Disease  . Cardiomyopathy    History of Present Illness: [name] is an 82 y.o. female last seen in October 2016. She presents for a routine follow-up visit. States that she had a good holiday with her family, stayed in [place]. She has been back home since that time, remains functional in her ADLs, reports NYHA class II dyspnea and no exertional angina. She has not required any nitroglycerin.  We reviewed her medications. She continues on aspirin, Lipitor, Coreg, and as needed nitroglycerin. She has not used any Lasix. Her weight remains stable. She denies any palpitations or syncope. Reports no recent falls.  Follow-up echocardiogram in October 2015 revealed basal LV septal hypertrophy with LVEF 20-25%, grade 1 diastolic dysfunction, and wall motion abnormalities consistent with ischemic cardiomyopathy. She wore a Lifevest defibrillator following hospital disharge with prior ACS, and was seen by Dr. Johney Frame in December 2015. They had a thorough discussion at that time, and decision was not to pursue implantation of an ICD.  Past Medical History  Diagnosis Date  . Essential hypertension, benign   . Anemia     Postoperative; hemoglobin of 11.5 in 04/2011  . LVH (left ventricular hypertrophy)     HOCM physiology  . Anxiety   . Hyperlipidemia   . Arthritis   . Palpitations   . Coronary atherosclerosis of native coronary artery     a. 03/31/14 Ant STEMI s/p DES to LAD; 40-50% diagonal 2 sten and 40% LAD sten  . Ischemic cardiomyopathy     a. 03/31/14 EF 15-20% w/ antersept/apical akinesis    Current Outpatient Prescriptions  Medication Sig Dispense Refill  . aspirin EC 81 MG EC tablet Take 1 tablet (81 mg total) by mouth daily.    Marland Kitchen atorvastatin (LIPITOR) 20 MG  tablet Take 20 mg by mouth daily at 6 PM.     . carvedilol (COREG) 6.25 MG tablet Take 1 tablet (6.25 mg total) by mouth 2 (two) times daily with a meal. 60 tablet 11  . furosemide (LASIX) 20 MG tablet Take daily ONLY as needed for abdominal swelling 30 tablet 6  . Multiple Vitamin (MULTIVITAMIN WITH MINERALS) TABS Take 1 tablet by mouth daily.    . nitroGLYCERIN (NITROSTAT) 0.4 MG SL tablet Place 1 tablet (0.4 mg total) under the tongue every 5 (five) minutes x 3 doses as needed for chest pain. 25 tablet 12   No current facility-administered medications for this visit.   Allergies:  Alprazolam and Caffeine   Social History: The patient  reports that she has never smoked. She has never used smokeless tobacco. She reports that she does not drink alcohol or use illicit drugs.   ROS:  Please see the history of present illness. Otherwise, complete review of systems is positive for shortness of breath when she bends over to tie her shoes..  All other systems are reviewed and negative.   Physical Exam: VS:  BP 122/74 mmHg  Pulse 81  Ht  (1.549 m)  Wt 107 lb 6.4 oz (48.716 kg)  BMI 20.30 kg/m2  SpO2 95%, BMI Body mass index is 20.3 kg/(m^2).  Wt Readings from Last 3 Encounters:  10/11/15 107 lb 6.4 oz (48.716 kg)  07/11/15 109 lb (49.442 kg)  03/02/15 108 lb (  48.988 kg)    General: Patient appears comfortable at rest. HEENT: Conjunctiva and lids normal, oropharynx clear with moist mucosa. Neck: Supple, no elevated JVP or carotid bruits, no thyromegaly. Lungs: Clear to auscultation, nonlabored breathing at rest. Cardiac: Regular rate and rhythm, no S3 or significant systolic murmur, no pericardial rub. Abdomen: Soft, protuberant, no hepatomegaly, bowel sounds present, no guarding or rebound. Extremities: No pitting edema, distal pulses 2+.  ECG: Tracing from 03/02/2015 showed sinus rhythm with old anterior infarct pattern and nonspecific ST changes.  Recent Labwork: 10/07/2015: BUN  20; Creat 0.60; Potassium 4.3; Sodium 140     Component Value Date/Time   CHOL 113 05/26/2014 0741   TRIG 103 05/26/2014 0741   HDL 59 05/26/2014 0741   CHOLHDL 1.9 05/26/2014 0741   VLDL 21 05/26/2014 0741   LDLCALC 33 05/26/2014 0741   LDLDIRECT 135* 02/02/2014 0710    Other Studies Reviewed Today:  Echocardiogram 07/16/2014: Study Conclusions  - Left ventricle: The cavity size was normal. There was focal basal hypertrophy. Systolic function was severely reduced. The estimated ejection fraction was in the range of 20% to 25%. Doppler parameters are consistent with abnormal left ventricular relaxation (grade 1 diastolic dysfunction). - Regional wall motion abnormality: Akinesis of the mid-apical anterior, mid anteroseptal, apical inferior, mid anterolateral, apical septal, apical lateral, and apical myocardium. No evidence of apical thrombus. - Aortic valve: Mildly calcified annulus. Mildly thickened leaflets. - Left atrium: The atrium was moderately dilated. - Systemic veins: IVC appears small, suggestive of low RA pressure and hypovolemia. - Technically adequate study.  Assessment and Plan:  1. Symptomatically stable CAD status post DES to the LAD in July 2015. Continue medical therapy and observation.  2. Ischemic cardiomyopathy with LVEF 20-25%. She has declined ICD. No progressive heart failure symptoms or weight gain. She has Lasix to use as needed. No palpitations or syncope.  3. Essential hypertension, blood pressure is well controlled today.  Current medicines were reviewed with the patient today.  Disposition: FU with me in 4 months.   Signed, Jonelle Sidle, MD, [facility] 10/11/2015 9:19 AM    [facility] at [facility] [address], [place], [place] [address] Phone: [phone]; Fax: [phone]

## 2015-10-12 ENCOUNTER — Other Ambulatory Visit: Payer: Self-pay | Admitting: Physician Assistant

## 2016-02-10 ENCOUNTER — Other Ambulatory Visit: Payer: Self-pay | Admitting: Cardiology

## 2016-02-17 ENCOUNTER — Ambulatory Visit: Payer: Medicare Other | Admitting: Cardiology

## 2016-02-21 ENCOUNTER — Ambulatory Visit (INDEPENDENT_AMBULATORY_CARE_PROVIDER_SITE_OTHER): Payer: Medicare Other | Admitting: Cardiology

## 2016-02-21 ENCOUNTER — Encounter: Payer: Self-pay | Admitting: Cardiology

## 2016-02-21 VITALS — BP 160/84 | HR 87 | Ht 61.0 in | Wt 109.0 lb

## 2016-02-21 DIAGNOSIS — E785 Hyperlipidemia, unspecified: Secondary | ICD-10-CM | POA: Diagnosis not present

## 2016-02-21 DIAGNOSIS — I10 Essential (primary) hypertension: Secondary | ICD-10-CM | POA: Diagnosis not present

## 2016-02-21 DIAGNOSIS — I2510 Atherosclerotic heart disease of native coronary artery without angina pectoris: Secondary | ICD-10-CM | POA: Diagnosis not present

## 2016-02-21 DIAGNOSIS — I255 Ischemic cardiomyopathy: Secondary | ICD-10-CM | POA: Diagnosis not present

## 2016-02-21 MED ORDER — ATORVASTATIN CALCIUM 10 MG PO TABS: 10.0000 mg | ORAL_TABLET | Freq: Every day | ORAL | Status: DC

## 2016-02-21 MED ORDER — LISINOPRIL 5 MG PO TABS: 5.0000 mg | ORAL_TABLET | Freq: Every day | ORAL | Status: DC

## 2016-02-21 NOTE — Patient Instructions (Signed)
Your physician wants you to follow-up in:  4 months You will receive a reminder letter in the mail two months in advance. If you don't receive a letter, please call our office to schedule the follow-up appointment.     DECREASE Lisinopril to 5 mg daily   DECREASE Atorvastatin to 10 mg daily     Thank you for choosing [facility] !
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
## 2016-04-24 ENCOUNTER — Other Ambulatory Visit: Payer: Self-pay | Admitting: Cardiology

## 2016-04-24 DIAGNOSIS — I25119 Atherosclerotic heart disease of native coronary artery with unspecified angina pectoris: Secondary | ICD-10-CM

## 2016-04-24 DIAGNOSIS — I517 Cardiomegaly: Secondary | ICD-10-CM

## 2016-07-02 ENCOUNTER — Encounter: Payer: Self-pay | Admitting: Cardiology
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
## 2016-07-03 ENCOUNTER — Ambulatory Visit (INDEPENDENT_AMBULATORY_CARE_PROVIDER_SITE_OTHER): Payer: Medicare Other | Admitting: Cardiology

## 2016-07-03 ENCOUNTER — Encounter: Payer: Self-pay | Admitting: Cardiology

## 2016-07-03 VITALS — BP 132/70 | HR 78 | Ht 61.0 in | Wt 109.2 lb

## 2016-07-03 DIAGNOSIS — Z789 Other specified health status: Secondary | ICD-10-CM

## 2016-07-03 DIAGNOSIS — I10 Essential (primary) hypertension: Secondary | ICD-10-CM

## 2016-07-03 DIAGNOSIS — I255 Ischemic cardiomyopathy: Secondary | ICD-10-CM | POA: Diagnosis not present

## 2016-07-03 DIAGNOSIS — I2510 Atherosclerotic heart disease of native coronary artery without angina pectoris: Secondary | ICD-10-CM

## 2016-07-03 DIAGNOSIS — E782 Mixed hyperlipidemia: Secondary | ICD-10-CM | POA: Diagnosis not present

## 2016-07-03 NOTE — Patient Instructions (Signed)
Your physician wants you to follow-up in: 6 months You will receive a reminder letter in the mail two months in advance. If you don't receive a letter, please call our office to schedule the follow-up appointment.     Your physician recommends that you continue on your current medications as directed. Please refer to the Current Medication list given to you today.      Thank you for choosing [facility] !        

## 2016-07-25 ENCOUNTER — Other Ambulatory Visit: Payer: Self-pay | Admitting: Cardiology

## 2016-07-25 DIAGNOSIS — I25119 Atherosclerotic heart disease of native coronary artery with unspecified angina pectoris: Secondary | ICD-10-CM

## 2016-07-25 DIAGNOSIS — I517 Cardiomegaly: Secondary | ICD-10-CM

## 2016-09-24 ENCOUNTER — Other Ambulatory Visit: Payer: Self-pay | Admitting: Cardiology

## 2016-09-24 DIAGNOSIS — I517 Cardiomegaly: Secondary | ICD-10-CM

## 2016-09-24 DIAGNOSIS — I25119 Atherosclerotic heart disease of native coronary artery with unspecified angina pectoris: Secondary | ICD-10-CM

## 2016-10-25 ENCOUNTER — Other Ambulatory Visit: Payer: Self-pay | Admitting: Cardiology

## 2016-10-25 DIAGNOSIS — I25119 Atherosclerotic heart disease of native coronary artery with unspecified angina pectoris: Secondary | ICD-10-CM

## 2016-10-25 DIAGNOSIS — I517 Cardiomegaly: Secondary | ICD-10-CM

## 2016-12-16 ENCOUNTER — Encounter: Payer: Self-pay | Admitting: Cardiology
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
## 2017-01-02 ENCOUNTER — Encounter: Payer: Self-pay | Admitting: Cardiology

## 2017-01-02 ENCOUNTER — Ambulatory Visit (INDEPENDENT_AMBULATORY_CARE_PROVIDER_SITE_OTHER): Payer: Medicare Other | Admitting: Cardiology

## 2017-01-02 VITALS — BP 128/80 | HR 71 | Ht 61.0 in | Wt 109.0 lb

## 2017-01-02 DIAGNOSIS — E782 Mixed hyperlipidemia: Secondary | ICD-10-CM

## 2017-01-02 DIAGNOSIS — I2510 Atherosclerotic heart disease of native coronary artery without angina pectoris: Secondary | ICD-10-CM

## 2017-01-02 DIAGNOSIS — I10 Essential (primary) hypertension: Secondary | ICD-10-CM

## 2017-01-02 DIAGNOSIS — I255 Ischemic cardiomyopathy: Secondary | ICD-10-CM

## 2017-01-02 MED ORDER — FUROSEMIDE 20 MG PO TABS: ORAL_TABLET | ORAL | 6 refills | Status: DC

## 2017-01-02 NOTE — Patient Instructions (Signed)
Your physician wants you to follow-up in: 6 months You will receive a reminder letter in the mail two months in advance. If you don't receive a letter, please call our office to schedule the follow-up appointment.    Take Lisinopril every day    If you need a refill on your cardiac medications before your next appointment, please call your pharmacy.     I refilled your Lasix      Thank you for choosing  [facility] !

## 2017-04-09 ENCOUNTER — Other Ambulatory Visit: Payer: Self-pay | Admitting: Cardiology

## 2017-05-07 ENCOUNTER — Other Ambulatory Visit: Payer: Self-pay | Admitting: Cardiology

## 2017-05-21 ENCOUNTER — Other Ambulatory Visit: Payer: Self-pay | Admitting: Cardiology

## 2017-05-21 DIAGNOSIS — I25119 Atherosclerotic heart disease of native coronary artery with unspecified angina pectoris: Secondary | ICD-10-CM

## 2017-05-21 DIAGNOSIS — I517 Cardiomegaly: Secondary | ICD-10-CM

## 2017-06-21 ENCOUNTER — Other Ambulatory Visit: Payer: Self-pay | Admitting: Cardiology

## 2017-06-21 DIAGNOSIS — I517 Cardiomegaly: Secondary | ICD-10-CM

## 2017-06-21 DIAGNOSIS — I25119 Atherosclerotic heart disease of native coronary artery with unspecified angina pectoris: Secondary | ICD-10-CM

## 2017-06-25 ENCOUNTER — Encounter (HOSPITAL_COMMUNITY)
Admission: RE | Disposition: A | Discharge status: Home / Self Care | Payer: Self-pay | Source: Ambulatory Visit | Attending: Ophthalmology

## 2017-06-25 ENCOUNTER — Ambulatory Visit (HOSPITAL_COMMUNITY)
Admission: RE | Admit: 2017-06-25 | Discharge: 2017-06-25 | Disposition: A | Discharge status: Home / Self Care | Payer: Medicare Other | Source: Ambulatory Visit | Attending: Ophthalmology | Admitting: Ophthalmology

## 2017-06-25 DIAGNOSIS — Z79899 Other long term (current) drug therapy: Secondary | ICD-10-CM | POA: Insufficient documentation

## 2017-06-25 DIAGNOSIS — M1990 Unspecified osteoarthritis, unspecified site: Secondary | ICD-10-CM | POA: Insufficient documentation

## 2017-06-25 DIAGNOSIS — H26491 Other secondary cataract, right eye: Secondary | ICD-10-CM | POA: Diagnosis not present

## 2017-06-25 DIAGNOSIS — Z7982 Long term (current) use of aspirin: Secondary | ICD-10-CM | POA: Diagnosis not present

## 2017-06-25 DIAGNOSIS — I10 Essential (primary) hypertension: Secondary | ICD-10-CM | POA: Insufficient documentation

## 2017-06-25 DIAGNOSIS — Z888 Allergy status to other drugs, medicaments and biological substances status: Secondary | ICD-10-CM | POA: Diagnosis not present

## 2017-06-25 DIAGNOSIS — Z91048 Other nonmedicinal substance allergy status: Secondary | ICD-10-CM | POA: Insufficient documentation

## 2017-06-25 HISTORY — PX: YAG LASER APPLICATION: SHX6189

## 2017-06-25 SURGERY — TREATMENT, USING YAG LASER
Anesthesia: LOCAL | Laterality: Right

## 2017-06-25 MED ORDER — CYCLOPENTOLATE-PHENYLEPHRINE 0.2-1 % OP SOLN
OPHTHALMIC | Status: AC
  Filled 2017-06-25: qty 2

## 2017-06-25 MED ORDER — CYCLOPENTOLATE-PHENYLEPHRINE 0.2-1 % OP SOLN
1.0000 [drp] | OPHTHALMIC | Status: AC
  Administered 2017-06-25 (×3): 1 [drp] via OPHTHALMIC

## 2017-06-25 NOTE — H&P (Signed)
The patient was re examined and there is no change in the patients condition since the original H and P. 

## 2017-06-25 NOTE — Op Note (Signed)
[name] T. Nile Riggs, MD  Procedure: Yag Capsulotomy  Yag Laser Self Test Completedyes. Procedure: Posterior Capsulotomy, Eye Protection Worn by Staff yes. Laser In Use Sign on Door yes.  Laser: Nd:YAG Spot Size: Fixed Burst Mode: III Power Setting: 3.4 mJ/burst Number of shots: 20 Total energy delivered: 62.84 mJ   The patient tolerated the procedure without difficulty. No complications were encountered.   The patient was discharged home with the instructions to continue all her current glaucoma medications, if any.   Patient instructed to go to office at 0100 for intraocular pressure check.  Patient verbalizes understanding of discharge instructions Yes.  .    Pre-Operative Diagnosis: After-Cataract, obscuring vision, 366.53 OD Post-Operative Diagnosis: After-Cataract, obscuring vision, 366.53 OD Date of Cataract Surgery: 07/29/2012

## 2017-06-25 NOTE — Discharge Instructions (Signed)
[name]  06/25/2017     Instructions    Activity: No Restrictions.   Diet: Resume Diet you were on at home.   Pain Medication: Tylenol if Needed.   CONTACT YOUR DOCTOR IF YOU HAVE PAIN, REDNESS IN YOUR EYE, OR DECREASED VISION.   Follow-up:today with Jethro Bolus, MD.   Dr. Nile Riggs: [phone]  Dr. Lita Mains: [phone]  Dr. Alto Denver: [phone]   If you find that you cannot contact your physician, but feel that your signs and   Symptoms warrant a physician's attention, call the Emergency Room at   [phone] ext.[phone].   Othern/a.  Follow up with Dr Nile Riggs today at 1:00 pm

## 2017-06-27 ENCOUNTER — Encounter (HOSPITAL_COMMUNITY): Payer: Self-pay | Admitting: Ophthalmology
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
## 2017-07-09 ENCOUNTER — Ambulatory Visit (INDEPENDENT_AMBULATORY_CARE_PROVIDER_SITE_OTHER): Payer: Medicare Other | Admitting: Cardiology

## 2017-07-09 ENCOUNTER — Encounter: Payer: Self-pay | Admitting: Cardiology

## 2017-07-09 ENCOUNTER — Ambulatory Visit (HOSPITAL_COMMUNITY)
Admission: RE | Admit: 2017-07-09 | Discharge: 2017-07-09 | Disposition: A | Discharge status: Home / Self Care | Payer: Medicare Other | Source: Ambulatory Visit | Attending: Cardiology | Admitting: Cardiology

## 2017-07-09 VITALS — BP 140/92 | HR 80 | Ht 61.0 in | Wt 110.4 lb

## 2017-07-09 DIAGNOSIS — I10 Essential (primary) hypertension: Secondary | ICD-10-CM

## 2017-07-09 DIAGNOSIS — J449 Chronic obstructive pulmonary disease, unspecified: Secondary | ICD-10-CM | POA: Insufficient documentation

## 2017-07-09 DIAGNOSIS — E782 Mixed hyperlipidemia: Secondary | ICD-10-CM | POA: Diagnosis not present

## 2017-07-09 DIAGNOSIS — I2510 Atherosclerotic heart disease of native coronary artery without angina pectoris: Secondary | ICD-10-CM | POA: Diagnosis not present

## 2017-07-09 DIAGNOSIS — I255 Ischemic cardiomyopathy: Secondary | ICD-10-CM | POA: Insufficient documentation

## 2017-07-09 DIAGNOSIS — I082 Rheumatic disorders of both aortic and tricuspid valves: Secondary | ICD-10-CM | POA: Insufficient documentation

## 2017-07-09 DIAGNOSIS — I119 Hypertensive heart disease without heart failure: Secondary | ICD-10-CM | POA: Insufficient documentation

## 2017-07-09 DIAGNOSIS — Z23 Encounter for immunization: Secondary | ICD-10-CM | POA: Diagnosis not present

## 2017-07-09 DIAGNOSIS — E785 Hyperlipidemia, unspecified: Secondary | ICD-10-CM | POA: Insufficient documentation

## 2017-07-09 LAB — ECHOCARDIOGRAM COMPLETE
CHL CUP MV DEC (S): 201
CHL CUP TV REG PEAK VELOCITY: 249 cm/s
EERAT: 16.56
EWDT: 201 ms
FS: 29 % (ref 28–44)
Height: 61 in
IV/PV OW: 0.94
LA ID, A-P, ES: 30 mm
LA diam end sys: 30 mm
LA diam index: 2.04 cm/m2
LA vol: 42.1 mL
LAVOLA4C: 26 mL
LAVOLIN: 28.6 mL/m2
LV PW d: 12.2 mm — AB (ref 0.6–1.1)
LV TDI E'LATERAL: 2.94
LV TDI E'MEDIAL: 3.26
LV e' LATERAL: 2.94 cm/s
LVEEAVG: 16.56
LVEEMED: 16.56
LVOT area: 2.84 cm2
LVOTD: 19 mm
Lateral S' vel: 12.2 cm/s
MVPKAVEL: 120 m/s
MVPKEVEL: 48.7 m/s
TAPSE: 18 mm
TR max vel: 249 cm/s
Weight: 1766.4 oz

## 2017-07-09 NOTE — Progress Notes (Signed)
*  PRELIMINARY RESULTS* Echocardiogram 2D Echocardiogram has been performed by Jeryl ColumbiaJohanna Elliott.  Stacey DrainWhite, Tyana Butzer J 07/09/2017, 11:49 AM

## 2017-07-09 NOTE — Patient Instructions (Signed)
Your physician wants you to follow-up in: 6 months with Dr McDowell You will receive a reminder letter in the mail two months in advance. If you don't receive a letter, please call our office to schedule the follow-up appointment.   Your physician has requested that you have an echocardiogram. Echocardiography is a painless test that uses sound waves to create images of your heart. It provides your doctor with information about the size and shape of your heart and how well your heart's chambers and valves are working. This procedure takes approximately one hour. There are no restrictions for this procedure.    Your physician recommends that you continue on your current medications as directed. Please refer to the Current Medication list given to you today.    If you need a refill on your cardiac medications before your next appointment, please call your pharmacy.     No lab work ordered today     Thank you for choosing [facility] !        

## 2017-07-18 ENCOUNTER — Other Ambulatory Visit: Payer: Self-pay | Admitting: Cardiology

## 2018-01-18 ENCOUNTER — Other Ambulatory Visit: Payer: Self-pay | Admitting: Cardiology

## 2018-01-18 DIAGNOSIS — I517 Cardiomegaly: Secondary | ICD-10-CM

## 2018-01-18 DIAGNOSIS — I25119 Atherosclerotic heart disease of native coronary artery with unspecified angina pectoris: Secondary | ICD-10-CM

## 2018-01-29 ENCOUNTER — Other Ambulatory Visit: Payer: Self-pay | Admitting: Cardiology
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
## 2018-02-14 ENCOUNTER — Encounter: Payer: Self-pay | Admitting: Cardiology

## 2018-02-14 ENCOUNTER — Ambulatory Visit: Payer: Medicare Other | Admitting: Cardiology

## 2018-02-14 VITALS — BP 132/80 | HR 82 | Ht 61.0 in | Wt 112.0 lb

## 2018-02-14 DIAGNOSIS — I255 Ischemic cardiomyopathy: Secondary | ICD-10-CM | POA: Diagnosis not present

## 2018-02-14 DIAGNOSIS — I2510 Atherosclerotic heart disease of native coronary artery without angina pectoris: Secondary | ICD-10-CM

## 2018-02-14 DIAGNOSIS — I10 Essential (primary) hypertension: Secondary | ICD-10-CM | POA: Diagnosis not present

## 2018-02-14 DIAGNOSIS — E782 Mixed hyperlipidemia: Secondary | ICD-10-CM

## 2018-02-14 NOTE — Patient Instructions (Signed)

## 2018-03-17 ENCOUNTER — Other Ambulatory Visit: Payer: Self-pay | Admitting: Cardiology

## 2018-07-09 ENCOUNTER — Other Ambulatory Visit: Payer: Self-pay

## 2018-07-09 ENCOUNTER — Emergency Department (HOSPITAL_COMMUNITY)
Admission: EM | Admit: 2018-07-09 | Discharge: 2018-07-09 | Disposition: A | Discharge status: Home / Self Care | Payer: Medicare Other | Attending: Emergency Medicine | Admitting: Emergency Medicine

## 2018-07-09 ENCOUNTER — Encounter (HOSPITAL_COMMUNITY): Payer: Self-pay

## 2018-07-09 DIAGNOSIS — F419 Anxiety disorder, unspecified: Secondary | ICD-10-CM

## 2018-07-09 DIAGNOSIS — Z5321 Procedure and treatment not carried out due to patient leaving prior to being seen by health care provider: Secondary | ICD-10-CM | POA: Diagnosis not present

## 2018-07-09 DIAGNOSIS — G4700 Insomnia, unspecified: Secondary | ICD-10-CM | POA: Insufficient documentation

## 2018-07-09 DIAGNOSIS — G479 Sleep disorder, unspecified: Secondary | ICD-10-CM

## 2018-07-09 NOTE — ED Notes (Addendum)
EDP reviewed d/c instructions with pt and let pt leave the department bc pt was requesting to leave at that time.

## 2018-07-09 NOTE — ED Notes (Signed)
Pt states she feels anxious and does not like to be home alone.

## 2018-07-09 NOTE — Discharge Instructions (Addendum)
We saw in the ER for difficulty sleeping.  It is not entirely clear why you are having difficulty in sleeping. Certainly this could be because of a primary sleep disorder.  But other possibilities include medication side effect and also condition like depression.  We would want you to engage with your primary care doctors about difficulty in sleeping. As discussed, sertraline certainly can cause sleep disturbance -but it appears that he started taking the medication after the problem started, therefore we do not want you to stop taking it unless your primary care doctor and you agree that it is not helpful.  Follow-up with your primary care doctor in 7 to 10 days. We wish you the very best.

## 2018-07-09 NOTE — ED Triage Notes (Signed)
Pt states she called EMS because she 'could not sleep and has been sick lately'...denies n/v/fever

## 2018-07-15 ENCOUNTER — Other Ambulatory Visit: Payer: Self-pay | Admitting: Cardiology

## 2018-07-15 DIAGNOSIS — I517 Cardiomegaly: Secondary | ICD-10-CM

## 2018-07-15 DIAGNOSIS — I25119 Atherosclerotic heart disease of native coronary artery with unspecified angina pectoris: Secondary | ICD-10-CM

## 2018-07-18 ENCOUNTER — Other Ambulatory Visit: Payer: Self-pay | Admitting: Cardiology
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
## 2018-08-25 ENCOUNTER — Encounter: Payer: Self-pay | Admitting: Cardiology

## 2018-08-25 ENCOUNTER — Ambulatory Visit: Payer: Medicare Other | Admitting: Cardiology

## 2018-08-25 VITALS — BP 138/82 | HR 79 | Ht 60.0 in | Wt 110.6 lb

## 2018-08-25 DIAGNOSIS — E782 Mixed hyperlipidemia: Secondary | ICD-10-CM

## 2018-08-25 DIAGNOSIS — I255 Ischemic cardiomyopathy: Secondary | ICD-10-CM

## 2018-08-25 NOTE — Patient Instructions (Signed)
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
## 2018-09-15 ENCOUNTER — Other Ambulatory Visit: Payer: Self-pay | Admitting: Cardiology

## 2018-09-15 DIAGNOSIS — I25119 Atherosclerotic heart disease of native coronary artery with unspecified angina pectoris: Secondary | ICD-10-CM

## 2018-09-15 DIAGNOSIS — I517 Cardiomegaly: Secondary | ICD-10-CM

## 2018-10-14 ENCOUNTER — Other Ambulatory Visit: Payer: Self-pay | Admitting: Cardiology

## 2018-10-29 ENCOUNTER — Other Ambulatory Visit: Payer: Self-pay | Admitting: Cardiology

## 2019-02-12 DIAGNOSIS — I10 Essential (primary) hypertension: Secondary | ICD-10-CM | POA: Diagnosis not present

## 2019-02-16 ENCOUNTER — Encounter: Payer: Self-pay | Admitting: Internal Medicine

## 2019-02-16 DIAGNOSIS — I10 Essential (primary) hypertension: Secondary | ICD-10-CM | POA: Diagnosis not present

## 2019-02-16 DIAGNOSIS — L600 Ingrowing nail: Secondary | ICD-10-CM | POA: Diagnosis not present

## 2019-02-16 DIAGNOSIS — E782 Mixed hyperlipidemia: Secondary | ICD-10-CM | POA: Diagnosis not present

## 2019-02-16 DIAGNOSIS — W57XXXA Bitten or stung by nonvenomous insect and other nonvenomous arthropods, initial encounter: Secondary | ICD-10-CM | POA: Diagnosis not present

## 2019-02-16 DIAGNOSIS — I2510 Atherosclerotic heart disease of native coronary artery without angina pectoris: Secondary | ICD-10-CM | POA: Diagnosis not present

## 2019-02-16 DIAGNOSIS — J302 Other seasonal allergic rhinitis: Secondary | ICD-10-CM | POA: Diagnosis not present

## 2019-02-16 DIAGNOSIS — F411 Generalized anxiety disorder: Secondary | ICD-10-CM | POA: Diagnosis not present

## 2019-03-18 ENCOUNTER — Encounter: Payer: Self-pay | Admitting: Cardiology

## 2019-03-18 ENCOUNTER — Ambulatory Visit (INDEPENDENT_AMBULATORY_CARE_PROVIDER_SITE_OTHER): Payer: PPO | Admitting: Cardiology

## 2019-03-18 ENCOUNTER — Other Ambulatory Visit: Payer: Self-pay

## 2019-03-18 VITALS — BP 133/73 | HR 90 | Temp 97.4°F | Ht 60.0 in | Wt 107.0 lb

## 2019-03-18 DIAGNOSIS — I10 Essential (primary) hypertension: Secondary | ICD-10-CM

## 2019-03-18 DIAGNOSIS — I255 Ischemic cardiomyopathy: Secondary | ICD-10-CM

## 2019-03-18 DIAGNOSIS — E782 Mixed hyperlipidemia: Secondary | ICD-10-CM | POA: Diagnosis not present

## 2019-03-18 NOTE — Patient Instructions (Signed)
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
## 2019-04-09 DIAGNOSIS — Z0000 Encounter for general adult medical examination without abnormal findings: Secondary | ICD-10-CM | POA: Diagnosis not present

## 2019-04-13 ENCOUNTER — Other Ambulatory Visit: Payer: Self-pay | Admitting: Cardiology

## 2019-04-13 DIAGNOSIS — I517 Cardiomegaly: Secondary | ICD-10-CM

## 2019-04-13 DIAGNOSIS — I25119 Atherosclerotic heart disease of native coronary artery with unspecified angina pectoris: Secondary | ICD-10-CM

## 2019-04-17 ENCOUNTER — Other Ambulatory Visit: Payer: Self-pay

## 2019-07-13 ENCOUNTER — Other Ambulatory Visit: Payer: Self-pay | Admitting: Cardiology

## 2019-07-28 ENCOUNTER — Other Ambulatory Visit: Payer: Self-pay | Admitting: Cardiology

## 2019-08-20 DIAGNOSIS — E782 Mixed hyperlipidemia: Secondary | ICD-10-CM | POA: Diagnosis not present

## 2019-08-20 DIAGNOSIS — I10 Essential (primary) hypertension: Secondary | ICD-10-CM | POA: Diagnosis not present

## 2019-08-24 DIAGNOSIS — I10 Essential (primary) hypertension: Secondary | ICD-10-CM | POA: Diagnosis not present

## 2019-08-24 DIAGNOSIS — I255 Ischemic cardiomyopathy: Secondary | ICD-10-CM | POA: Diagnosis not present

## 2019-08-24 DIAGNOSIS — I2510 Atherosclerotic heart disease of native coronary artery without angina pectoris: Secondary | ICD-10-CM | POA: Diagnosis not present

## 2019-08-24 DIAGNOSIS — J302 Other seasonal allergic rhinitis: Secondary | ICD-10-CM | POA: Diagnosis not present

## 2019-08-24 DIAGNOSIS — E875 Hyperkalemia: Secondary | ICD-10-CM | POA: Diagnosis not present

## 2019-08-24 DIAGNOSIS — E782 Mixed hyperlipidemia: Secondary | ICD-10-CM | POA: Diagnosis not present

## 2019-09-23 ENCOUNTER — Other Ambulatory Visit: Payer: Self-pay

## 2019-09-23 ENCOUNTER — Encounter: Payer: Self-pay | Admitting: Cardiology

## 2019-09-23 ENCOUNTER — Ambulatory Visit: Payer: PPO | Admitting: Cardiology

## 2019-09-23 VITALS — BP 146/84 | HR 79 | Temp 97.1°F | Ht 60.0 in | Wt 108.0 lb

## 2019-09-23 DIAGNOSIS — E782 Mixed hyperlipidemia: Secondary | ICD-10-CM

## 2019-09-23 DIAGNOSIS — I25119 Atherosclerotic heart disease of native coronary artery with unspecified angina pectoris: Secondary | ICD-10-CM | POA: Diagnosis not present

## 2019-09-23 DIAGNOSIS — I255 Ischemic cardiomyopathy: Secondary | ICD-10-CM

## 2019-09-23 NOTE — Patient Instructions (Signed)
Medication Instructions:  Your physician recommends that you continue on your current medications as directed. Please refer to the Current Medication list given to you today.  *If you need a refill on your cardiac medications before your next appointment, please call your pharmacy*  Lab Work: NONE If you have labs (blood work) drawn today and your tests are completely normal, you will receive your results only by: . MyChart Message (if you have MyChart) OR . A paper copy in the mail If you have any lab test that is abnormal or we need to change your treatment, we will call you to review the results.  Testing/Procedures: NONE  Follow-Up: At [facility], you and your health needs are our priority.  As part of our continuing mission to provide you with exceptional heart care, we have created designated Provider Care Teams.  These Care Teams include your primary Cardiologist (physician) and Advanced Practice Providers (APPs -  Physician Assistants and Nurse Practitioners) who all work together to provide you with the care you need, when you need it.  Your next appointment:   6 month(s)  The format for your next appointment:   In Person  Provider:   Samuel McDowell, MD  Other Instructions NONE      Thank you for choosing [facility] !         
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
## 2019-10-12 ENCOUNTER — Other Ambulatory Visit: Payer: Self-pay | Admitting: Cardiology

## 2019-10-12 DIAGNOSIS — I25119 Atherosclerotic heart disease of native coronary artery with unspecified angina pectoris: Secondary | ICD-10-CM

## 2019-10-12 DIAGNOSIS — I517 Cardiomegaly: Secondary | ICD-10-CM

## 2019-12-10 ENCOUNTER — Other Ambulatory Visit: Payer: Self-pay | Admitting: Cardiology

## 2019-12-10 DIAGNOSIS — I25119 Atherosclerotic heart disease of native coronary artery with unspecified angina pectoris: Secondary | ICD-10-CM

## 2019-12-10 DIAGNOSIS — I517 Cardiomegaly: Secondary | ICD-10-CM

## 2020-01-25 ENCOUNTER — Other Ambulatory Visit: Payer: Self-pay | Admitting: Cardiology

## 2020-02-17 ENCOUNTER — Telehealth: Payer: Self-pay
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
## 2020-02-18 ENCOUNTER — Telehealth (INDEPENDENT_AMBULATORY_CARE_PROVIDER_SITE_OTHER): Payer: PPO | Admitting: Cardiology

## 2020-02-18 ENCOUNTER — Encounter: Payer: Self-pay | Admitting: Cardiology

## 2020-02-18 VITALS — BP 137/74 | Ht 60.0 in | Wt 108.0 lb

## 2020-02-18 DIAGNOSIS — I25119 Atherosclerotic heart disease of native coronary artery with unspecified angina pectoris: Secondary | ICD-10-CM

## 2020-02-18 DIAGNOSIS — E782 Mixed hyperlipidemia: Secondary | ICD-10-CM | POA: Diagnosis not present

## 2020-02-18 DIAGNOSIS — I255 Ischemic cardiomyopathy: Secondary | ICD-10-CM

## 2020-02-18 NOTE — Patient Instructions (Signed)
Medication Instructions:   Your physician recommends that you continue on your current medications as directed. Please refer to the Current Medication list given to you today.  *If you need a refill on your cardiac medications before your next appointment, please call your pharmacy*   Lab Work: NONE ORDERED  TODAY   If you have labs (blood work) drawn today and your tests are completely normal, you will receive your results only by: . MyChart Message (if you have MyChart) OR . A paper copy in the mail If you have any lab test that is abnormal or we need to change your treatment, we will call you to review the results.   Testing/Procedures: NONE ORDERED  TODAY   Follow-Up: At [facility], you and your health needs are our priority.  As part of our continuing mission to provide you with exceptional heart care, we have created designated Provider Care Teams.  These Care Teams include your primary Cardiologist (physician) and Advanced Practice Providers (APPs -  Physician Assistants and Nurse Practitioners) who all work together to provide you with the care you need, when you need it.  We recommend signing up for the patient portal called "MyChart".  Sign up information is provided on this After Visit Summary.  MyChart is used to connect with patients for Virtual Visits (Telemedicine).  Patients are able to view lab/test results, encounter notes, upcoming appointments, etc.  Non-urgent messages can be sent to your provider as well.   To learn more about what you can do with MyChart, go to [email or web address].    Your next appointment:   6 month(s)  The format for your next appointment:   In Person  Provider:   You may see Samuel McDowell, MD or one of the following Advanced Practice Providers on your designated Care Team:    Brittany Strader, PA-C   Michele Lenze, PA-C     Other Instructions   

## 2020-02-24 DIAGNOSIS — I255 Ischemic cardiomyopathy: Secondary | ICD-10-CM | POA: Diagnosis not present

## 2020-02-24 DIAGNOSIS — E875 Hyperkalemia: Secondary | ICD-10-CM | POA: Diagnosis not present

## 2020-02-24 DIAGNOSIS — F410 Panic disorder [episodic paroxysmal anxiety] without agoraphobia: Secondary | ICD-10-CM | POA: Diagnosis not present

## 2020-02-24 DIAGNOSIS — F419 Anxiety disorder, unspecified: Secondary | ICD-10-CM | POA: Diagnosis not present

## 2020-02-24 DIAGNOSIS — F411 Generalized anxiety disorder: Secondary | ICD-10-CM | POA: Diagnosis not present

## 2020-02-24 DIAGNOSIS — I10 Essential (primary) hypertension: Secondary | ICD-10-CM | POA: Diagnosis not present

## 2020-02-24 DIAGNOSIS — J060 Acute laryngopharyngitis: Secondary | ICD-10-CM | POA: Diagnosis not present

## 2020-02-24 DIAGNOSIS — J019 Acute sinusitis, unspecified: Secondary | ICD-10-CM | POA: Diagnosis not present

## 2020-02-24 DIAGNOSIS — G4700 Insomnia, unspecified: Secondary | ICD-10-CM | POA: Diagnosis not present

## 2020-02-24 DIAGNOSIS — E782 Mixed hyperlipidemia: Secondary | ICD-10-CM | POA: Diagnosis not present

## 2020-02-24 DIAGNOSIS — I2510 Atherosclerotic heart disease of native coronary artery without angina pectoris: Secondary | ICD-10-CM | POA: Diagnosis not present

## 2020-02-24 DIAGNOSIS — I259 Chronic ischemic heart disease, unspecified: Secondary | ICD-10-CM | POA: Diagnosis not present

## 2020-03-02 DIAGNOSIS — I2510 Atherosclerotic heart disease of native coronary artery without angina pectoris: Secondary | ICD-10-CM | POA: Diagnosis not present

## 2020-03-02 DIAGNOSIS — I10 Essential (primary) hypertension: Secondary | ICD-10-CM | POA: Diagnosis not present

## 2020-03-02 DIAGNOSIS — E875 Hyperkalemia: Secondary | ICD-10-CM | POA: Diagnosis not present

## 2020-03-02 DIAGNOSIS — J302 Other seasonal allergic rhinitis: Secondary | ICD-10-CM | POA: Diagnosis not present

## 2020-03-02 DIAGNOSIS — I255 Ischemic cardiomyopathy: Secondary | ICD-10-CM | POA: Diagnosis not present

## 2020-03-02 DIAGNOSIS — E782 Mixed hyperlipidemia: Secondary | ICD-10-CM | POA: Diagnosis not present

## 2020-04-12 ENCOUNTER — Other Ambulatory Visit: Payer: Self-pay

## 2020-05-09 ENCOUNTER — Telehealth: Payer: Self-pay | Admitting: Cardiology

## 2020-05-09 NOTE — Telephone Encounter (Signed)
Spoke with pt who states that she read an article in the [place] paper regarding statins and the loss of CoQ10. Pt states that the article stated that this can lead to gum disease. Pt would like to know if there is any reason she should be worried. She has loss 3 teeth and is currently taking atorvastatin. She does have CoQ10 on hand but has not started taking it. Please advise.

## 2020-05-09 NOTE — Telephone Encounter (Signed)
I pulled the article in question, done by the [facility].  Actually, the issue is very controversial and no definitive statement was made about getting any benefit from taking supplemental co-Q10.  I would suggest that she stays on the low-dose Lipitor.

## 2020-05-09 NOTE — Telephone Encounter (Signed)
Pt notified and voiced understanding 

## 2020-05-09 NOTE — Telephone Encounter (Signed)
New message    Pt c/o medication issue:  1. Name of Medication: atorvastatin (LIPITOR) 10 MG tablet coq10  2. How are you currently taking this medication (dosage and times per day)? 1 x a day  3. Are you having a reaction (difficulty breathing--STAT)? no  4. What is your medication issue? Patient read a article that states that if you take atorvastatin that it can lead to gum disease and that you should take COQ10 with it.  She is concerned because she has lost 3 test.  Please call to discuss this

## 2020-07-04 NOTE — Telephone Encounter (Signed)
Some clinicians suggest use of co-Q10 supplementation in patients taking statin therapy to reduce the incidence of myopathy, although there does not exist very much solid data from randomized control trials to support this.  In terms of giving her more energy, I would be hard pressed to say that that would be the case.  I think her antihypertensive regimen looks reasonable.  Main concern would be to avoid overaggressive reduction in blood pressure and hypotension, her last blood pressure looked good at 137/74.  As you point out, she may be experiencing some trouble with her memory at age 86, so I would not necessarily think that discontinuing or reducing any of her current antihypertensive medications would necessarily make a noticeable difference.

## 2020-07-08 ENCOUNTER — Other Ambulatory Visit: Payer: Self-pay | Admitting: Cardiology

## 2020-07-08 DIAGNOSIS — I25119 Atherosclerotic heart disease of native coronary artery with unspecified angina pectoris: Secondary | ICD-10-CM

## 2020-07-08 DIAGNOSIS — I517 Cardiomegaly: Secondary | ICD-10-CM

## 2020-07-22 DIAGNOSIS — M9905 Segmental and somatic dysfunction of pelvic region: Secondary | ICD-10-CM | POA: Diagnosis not present

## 2020-07-22 DIAGNOSIS — M9902 Segmental and somatic dysfunction of thoracic region: Secondary | ICD-10-CM | POA: Diagnosis not present

## 2020-07-22 DIAGNOSIS — M9903 Segmental and somatic dysfunction of lumbar region: Secondary | ICD-10-CM | POA: Diagnosis not present

## 2020-07-22 DIAGNOSIS — M62830 Muscle spasm of back: Secondary | ICD-10-CM | POA: Diagnosis not present

## 2020-07-25 DIAGNOSIS — M9902 Segmental and somatic dysfunction of thoracic region: Secondary | ICD-10-CM | POA: Diagnosis not present

## 2020-07-25 DIAGNOSIS — M9905 Segmental and somatic dysfunction of pelvic region: Secondary | ICD-10-CM | POA: Diagnosis not present

## 2020-07-25 DIAGNOSIS — M62830 Muscle spasm of back: Secondary | ICD-10-CM | POA: Diagnosis not present

## 2020-07-25 DIAGNOSIS — M9903 Segmental and somatic dysfunction of lumbar region: Secondary | ICD-10-CM | POA: Diagnosis not present

## 2020-07-29 DIAGNOSIS — M9902 Segmental and somatic dysfunction of thoracic region: Secondary | ICD-10-CM | POA: Diagnosis not present

## 2020-07-29 DIAGNOSIS — M62830 Muscle spasm of back: Secondary | ICD-10-CM | POA: Diagnosis not present

## 2020-07-29 DIAGNOSIS — M9905 Segmental and somatic dysfunction of pelvic region: Secondary | ICD-10-CM | POA: Diagnosis not present

## 2020-07-29 DIAGNOSIS — M9903 Segmental and somatic dysfunction of lumbar region: Secondary | ICD-10-CM | POA: Diagnosis not present

## 2020-08-01 DIAGNOSIS — M62830 Muscle spasm of back: Secondary | ICD-10-CM | POA: Diagnosis not present

## 2020-08-29 ENCOUNTER — Ambulatory Visit: Payer: PPO | Admitting: Cardiology

## 2020-09-01 ENCOUNTER — Encounter: Payer: Self-pay | Admitting: Orthopedic Surgery

## 2020-09-02 DIAGNOSIS — E782 Mixed hyperlipidemia: Secondary | ICD-10-CM | POA: Diagnosis not present

## 2020-09-02 DIAGNOSIS — R000 Tachycardia, unspecified: Secondary | ICD-10-CM | POA: Diagnosis not present

## 2020-09-02 DIAGNOSIS — M7020 Olecranon bursitis, unspecified elbow: Secondary | ICD-10-CM | POA: Diagnosis not present

## 2020-09-02 DIAGNOSIS — I259 Chronic ischemic heart disease, unspecified: Secondary | ICD-10-CM | POA: Diagnosis not present

## 2020-09-02 DIAGNOSIS — F411 Generalized anxiety disorder: Secondary | ICD-10-CM | POA: Diagnosis not present

## 2020-09-02 DIAGNOSIS — F419 Anxiety disorder, unspecified: Secondary | ICD-10-CM | POA: Diagnosis not present

## 2020-09-02 DIAGNOSIS — I2510 Atherosclerotic heart disease of native coronary artery without angina pectoris: Secondary | ICD-10-CM | POA: Diagnosis not present

## 2020-09-02 DIAGNOSIS — I10 Essential (primary) hypertension: Secondary | ICD-10-CM | POA: Diagnosis not present

## 2020-09-02 DIAGNOSIS — E875 Hyperkalemia: Secondary | ICD-10-CM | POA: Diagnosis not present

## 2020-09-02 DIAGNOSIS — Z0000 Encounter for general adult medical examination without abnormal findings: Secondary | ICD-10-CM | POA: Diagnosis not present

## 2020-09-02 DIAGNOSIS — J309 Allergic rhinitis, unspecified: Secondary | ICD-10-CM | POA: Diagnosis not present

## 2020-09-02 DIAGNOSIS — J060 Acute laryngopharyngitis: Secondary | ICD-10-CM | POA: Diagnosis not present

## 2020-09-07 DIAGNOSIS — F419 Anxiety disorder, unspecified: Secondary | ICD-10-CM | POA: Diagnosis not present

## 2020-09-07 DIAGNOSIS — I255 Ischemic cardiomyopathy: Secondary | ICD-10-CM | POA: Diagnosis not present

## 2020-09-07 DIAGNOSIS — E875 Hyperkalemia: Secondary | ICD-10-CM | POA: Diagnosis not present

## 2020-09-07 DIAGNOSIS — E782 Mixed hyperlipidemia: Secondary | ICD-10-CM | POA: Diagnosis not present

## 2020-09-07 DIAGNOSIS — J302 Other seasonal allergic rhinitis: Secondary | ICD-10-CM | POA: Diagnosis not present

## 2020-09-07 DIAGNOSIS — I2510 Atherosclerotic heart disease of native coronary artery without angina pectoris: Secondary | ICD-10-CM | POA: Diagnosis not present

## 2020-09-07 DIAGNOSIS — R7301 Impaired fasting glucose: Secondary | ICD-10-CM | POA: Diagnosis not present

## 2020-09-07 DIAGNOSIS — I10 Essential (primary) hypertension: Secondary | ICD-10-CM | POA: Diagnosis not present

## 2020-09-08 ENCOUNTER — Other Ambulatory Visit: Payer: PPO

## 2020-09-08 DIAGNOSIS — Z20822 Contact with and (suspected) exposure to covid-19: Secondary | ICD-10-CM | POA: Diagnosis not present

## 2020-09-10 LAB — SARS-COV-2, NAA 2 DAY TAT

## 2020-09-10 LAB — NOVEL CORONAVIRUS, NAA: SARS-CoV-2, NAA: NOT DETECTED
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
## 2020-09-30 ENCOUNTER — Encounter: Payer: Self-pay | Admitting: Student

## 2020-09-30 ENCOUNTER — Ambulatory Visit: Payer: PPO | Admitting: Student

## 2020-09-30 ENCOUNTER — Other Ambulatory Visit: Payer: Self-pay

## 2020-09-30 VITALS — BP 138/76 | HR 94 | Ht 60.0 in | Wt 105.0 lb

## 2020-09-30 DIAGNOSIS — I2510 Atherosclerotic heart disease of native coronary artery without angina pectoris: Secondary | ICD-10-CM

## 2020-09-30 DIAGNOSIS — I10 Essential (primary) hypertension: Secondary | ICD-10-CM

## 2020-09-30 DIAGNOSIS — E785 Hyperlipidemia, unspecified: Secondary | ICD-10-CM

## 2020-09-30 DIAGNOSIS — I255 Ischemic cardiomyopathy: Secondary | ICD-10-CM | POA: Diagnosis not present

## 2020-09-30 NOTE — Patient Instructions (Signed)
Medication Instructions:  Your physician recommends that you continue on your current medications as directed. Please refer to the Current Medication list given to you today.  *If you need a refill on your cardiac medications before your next appointment, please call your pharmacy*   Lab Work: NONE   If you have labs (blood work) drawn today and your tests are completely normal, you will receive your results only by: . MyChart Message (if you have MyChart) OR . A paper copy in the mail If you have any lab test that is abnormal or we need to change your treatment, we will call you to review the results.   Testing/Procedures: NONE    Follow-Up: At [facility], you and your health needs are our priority.  As part of our continuing mission to provide you with exceptional heart care, we have created designated Provider Care Teams.  These Care Teams include your primary Cardiologist (physician) and Advanced Practice Providers (APPs -  Physician Assistants and Nurse Practitioners) who all work together to provide you with the care you need, when you need it.  We recommend signing up for the patient portal called "MyChart".  Sign up information is provided on this After Visit Summary.  MyChart is used to connect with patients for Virtual Visits (Telemedicine).  Patients are able to view lab/test results, encounter notes, upcoming appointments, etc.  Non-urgent messages can be sent to your provider as well.   To learn more about what you can do with MyChart, go to [email or web address].    Your next appointment:   6 month(s)  The format for your next appointment:   In Person  Provider:   Samuel McDowell, MD   Other Instructions Thank you for choosing [facility]!    

## 2020-10-15 DIAGNOSIS — I255 Ischemic cardiomyopathy: Secondary | ICD-10-CM | POA: Diagnosis not present

## 2020-10-15 DIAGNOSIS — E875 Hyperkalemia: Secondary | ICD-10-CM | POA: Diagnosis not present

## 2020-10-15 DIAGNOSIS — J302 Other seasonal allergic rhinitis: Secondary | ICD-10-CM | POA: Diagnosis not present

## 2020-10-15 DIAGNOSIS — I2510 Atherosclerotic heart disease of native coronary artery without angina pectoris: Secondary | ICD-10-CM | POA: Diagnosis not present

## 2020-10-15 DIAGNOSIS — I10 Essential (primary) hypertension: Secondary | ICD-10-CM | POA: Diagnosis not present

## 2020-10-15 DIAGNOSIS — E782 Mixed hyperlipidemia: Secondary | ICD-10-CM | POA: Diagnosis not present

## 2020-11-08 ENCOUNTER — Other Ambulatory Visit: Payer: Self-pay | Admitting: Cardiology

## 2020-11-08 DIAGNOSIS — I25119 Atherosclerotic heart disease of native coronary artery with unspecified angina pectoris: Secondary | ICD-10-CM

## 2020-11-08 DIAGNOSIS — I517 Cardiomegaly: Secondary | ICD-10-CM

## 2020-11-21 DIAGNOSIS — Z955 Presence of coronary angioplasty implant and graft: Secondary | ICD-10-CM | POA: Diagnosis not present

## 2020-11-21 DIAGNOSIS — I252 Old myocardial infarction: Secondary | ICD-10-CM | POA: Diagnosis not present

## 2020-11-21 DIAGNOSIS — Z7982 Long term (current) use of aspirin: Secondary | ICD-10-CM | POA: Diagnosis not present

## 2020-11-21 DIAGNOSIS — I25119 Atherosclerotic heart disease of native coronary artery with unspecified angina pectoris: Secondary | ICD-10-CM | POA: Diagnosis not present

## 2020-11-21 DIAGNOSIS — I10 Essential (primary) hypertension: Secondary | ICD-10-CM | POA: Diagnosis not present

## 2020-11-21 DIAGNOSIS — Z8249 Family history of ischemic heart disease and other diseases of the circulatory system: Secondary | ICD-10-CM | POA: Diagnosis not present

## 2020-11-21 DIAGNOSIS — Z823 Family history of stroke: Secondary | ICD-10-CM | POA: Diagnosis not present

## 2021-03-01 DIAGNOSIS — E875 Hyperkalemia: Secondary | ICD-10-CM | POA: Insufficient documentation

## 2021-03-03 DIAGNOSIS — I10 Essential (primary) hypertension: Secondary | ICD-10-CM | POA: Diagnosis not present

## 2021-03-08 DIAGNOSIS — I255 Ischemic cardiomyopathy: Secondary | ICD-10-CM | POA: Diagnosis not present

## 2021-03-08 DIAGNOSIS — R7301 Impaired fasting glucose: Secondary | ICD-10-CM | POA: Diagnosis not present

## 2021-03-08 DIAGNOSIS — E782 Mixed hyperlipidemia: Secondary | ICD-10-CM | POA: Diagnosis not present

## 2021-03-08 DIAGNOSIS — E875 Hyperkalemia: Secondary | ICD-10-CM | POA: Diagnosis not present

## 2021-03-08 DIAGNOSIS — I2510 Atherosclerotic heart disease of native coronary artery without angina pectoris: Secondary | ICD-10-CM | POA: Diagnosis not present

## 2021-03-08 DIAGNOSIS — I10 Essential (primary) hypertension: Secondary | ICD-10-CM | POA: Diagnosis not present

## 2021-03-08 DIAGNOSIS — I420 Dilated cardiomyopathy: Secondary | ICD-10-CM | POA: Insufficient documentation

## 2021-03-16 DIAGNOSIS — E1165 Type 2 diabetes mellitus with hyperglycemia: Secondary | ICD-10-CM | POA: Diagnosis not present

## 2021-03-16 DIAGNOSIS — I10 Essential (primary) hypertension: Secondary | ICD-10-CM | POA: Diagnosis not present

## 2021-03-27 DIAGNOSIS — I10 Essential (primary) hypertension: Secondary | ICD-10-CM | POA: Diagnosis not present

## 2021-03-27 DIAGNOSIS — E1165 Type 2 diabetes mellitus with hyperglycemia: Secondary | ICD-10-CM | POA: Diagnosis not present

## 2021-05-05 ENCOUNTER — Encounter: Payer: Self-pay | Admitting: Student

## 2021-05-05 ENCOUNTER — Ambulatory Visit: Payer: Medicare HMO | Admitting: Student

## 2021-05-05 ENCOUNTER — Other Ambulatory Visit: Payer: Self-pay

## 2021-05-05 VITALS — BP 142/82 | HR 85 | Ht 60.0 in | Wt 104.8 lb

## 2021-05-05 DIAGNOSIS — I350 Nonrheumatic aortic (valve) stenosis: Secondary | ICD-10-CM | POA: Diagnosis not present

## 2021-05-05 DIAGNOSIS — I10 Essential (primary) hypertension: Secondary | ICD-10-CM

## 2021-05-05 DIAGNOSIS — I2510 Atherosclerotic heart disease of native coronary artery without angina pectoris: Secondary | ICD-10-CM

## 2021-05-05 DIAGNOSIS — I5022 Chronic systolic (congestive) heart failure: Secondary | ICD-10-CM | POA: Diagnosis not present

## 2021-05-05 DIAGNOSIS — E785 Hyperlipidemia, unspecified: Secondary | ICD-10-CM | POA: Diagnosis not present

## 2021-05-05 MED ORDER — ROSUVASTATIN CALCIUM 5 MG PO TABS: 5.0000 mg | ORAL_TABLET | Freq: Every day | ORAL | 3 refills | Status: DC
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
## 2021-05-05 NOTE — Patient Instructions (Signed)
Medication Instructions:  Your physician has recommended you make the following change in your medication:  START Crestor 5 mg tablets daily  *If you need a refill on your cardiac medications before your next appointment, please call your pharmacy*   Lab Work: None If you have labs (blood work) drawn today and your tests are completely normal, you will receive your results only by: MyChart Message (if you have MyChart) OR A paper copy in the mail If you have any lab test that is abnormal or we need to change your treatment, we will call you to review the results.   Testing/Procedures: Your physician has requested that you have an echocardiogram. Echocardiography is a painless test that uses sound waves to create images of your heart. It provides your doctor with information about the size and shape of your heart and how well your heart's chambers and valves are working. This procedure takes approximately one hour. There are no restrictions for this procedure.    Follow-Up: At [facility], you and your health needs are our priority.  As part of our continuing mission to provide you with exceptional heart care, we have created designated Provider Care Teams.  These Care Teams include your primary Cardiologist (physician) and Advanced Practice Providers (APPs -  Physician Assistants and Nurse Practitioners) who all work together to provide you with the care you need, when you need it.  We recommend signing up for the patient portal called "MyChart".  Sign up information is provided on this After Visit Summary.  MyChart is used to connect with patients for Virtual Visits (Telemedicine).  Patients are able to view lab/test results, encounter notes, upcoming appointments, etc.  Non-urgent messages can be sent to your provider as well.   To learn more about what you can do with MyChart, go to [email or web address].    Your next appointment:   6 month(s)  The format for your next  appointment:   In Person  Provider:   Nona Dell, MD   Other Instructions

## 2021-05-06 ENCOUNTER — Encounter: Payer: Self-pay | Admitting: Student

## 2021-06-12 ENCOUNTER — Ambulatory Visit (HOSPITAL_COMMUNITY)
Admission: RE | Admit: 2021-06-12 | Discharge: 2021-06-12 | Disposition: A | Discharge status: Home / Self Care | Payer: Medicare HMO | Source: Ambulatory Visit | Attending: Student | Admitting: Student

## 2021-06-12 DIAGNOSIS — I5022 Chronic systolic (congestive) heart failure: Secondary | ICD-10-CM

## 2021-06-12 LAB — ECHOCARDIOGRAM COMPLETE
AR max vel: 1.45 cm2
AV Area VTI: 1.44 cm2
AV Area mean vel: 1.35 cm2
AV Mean grad: 8 mmHg
AV Peak grad: 11.8 mmHg
Ao pk vel: 1.72 m/s
Area-P 1/2: 1.46 cm2
S' Lateral: 2.6 cm

## 2021-06-12 NOTE — Progress Notes (Signed)
*  PRELIMINARY RESULTS* Echocardiogram 2D Echocardiogram has been performed.  Stacey Drain 06/12/2021, 1:53 PM

## 2021-06-13 ENCOUNTER — Telehealth: Payer: Self-pay

## 2021-06-13 NOTE — Telephone Encounter (Signed)
-----   Message from Ellsworth Lennox, [place] sent at 06/13/2021  7:56 AM EDT ----- Please let the patient know her echocardiogram shows the pumping function of her heart has significantly improved since prior imaging! Was previously as low as 15-20% in 2015, at 25-30% by echo in 2018. Her ejection fraction is now at 40-45% (normal is 55-65%). Her aortic stenosis remains in a mild range. At the time of her office visit, she was not really inclined to make medication changes. If she does wish to make changes, adding Spironolactone 12.5mg  daily would be reasonable to help with the pumping function of her heart (would need a repeat BMET in 2 weeks and would make sure she is not taking any potassium supplementation). If she does not wish to make changes at this time, we can review at follow-up visits.

## 2021-06-13 NOTE — Telephone Encounter (Signed)
Pt verbalized understanding of result note. Pt has decided to wait until f/u to make medication management decisions. PCP copied.

## 2021-07-13 ENCOUNTER — Other Ambulatory Visit: Payer: Self-pay | Admitting: Cardiology

## 2021-09-19 DIAGNOSIS — I5022 Chronic systolic (congestive) heart failure: Secondary | ICD-10-CM | POA: Insufficient documentation

## 2021-09-19 DIAGNOSIS — I255 Ischemic cardiomyopathy: Secondary | ICD-10-CM | POA: Diagnosis not present

## 2021-09-19 DIAGNOSIS — R7301 Impaired fasting glucose: Secondary | ICD-10-CM | POA: Diagnosis not present

## 2021-09-19 DIAGNOSIS — I2510 Atherosclerotic heart disease of native coronary artery without angina pectoris: Secondary | ICD-10-CM | POA: Diagnosis not present

## 2021-09-19 DIAGNOSIS — I10 Essential (primary) hypertension: Secondary | ICD-10-CM | POA: Diagnosis not present

## 2021-09-19 DIAGNOSIS — E782 Mixed hyperlipidemia: Secondary | ICD-10-CM | POA: Diagnosis not present

## 2021-09-19 DIAGNOSIS — E875 Hyperkalemia: Secondary | ICD-10-CM | POA: Diagnosis not present

## 2021-09-26 ENCOUNTER — Telehealth: Payer: Self-pay

## 2021-09-26 MED ORDER — EZETIMIBE 10 MG PO TABS: 10.0000 mg | ORAL_TABLET | Freq: Every day | ORAL | 6 refills | Status: DC

## 2021-09-26 NOTE — Telephone Encounter (Signed)
Patient wishes to start zetia 10 mg qd,e-scribed to [facility] pharmacy

## 2021-09-26 NOTE — Telephone Encounter (Signed)
Patient would like to go over the medication you talked to her about this morning.

## 2021-09-26 NOTE — Telephone Encounter (Signed)
-----   Message from Jonelle Sidle, MD sent at 09/25/2021  8:29 PM EST ----- Results reviewed. LDL 113. Is she still on Crestor 5 mg daily and tolerating? If so would suggest increasing to 10 mg daily.

## 2021-09-26 NOTE — Telephone Encounter (Signed)
Patient wanted to clarify that we has seen labs from Dr.Hall.We have and that is why we placed her on zetia.

## 2021-09-26 NOTE — Telephone Encounter (Signed)
[name] stopped Crestor because he legs ached . Please advise.

## 2021-09-27 ENCOUNTER — Telehealth: Payer: Self-pay | Admitting: Cardiology

## 2021-09-27 NOTE — Telephone Encounter (Signed)
Spoke with Dr. Roque Lias who is the pt's eye care provider and son in law. Dr. Roque Lias states that the patient has now been taking anything for her cholesterol for over a year now. He states that the pt currently has both Lipitor and Crestor on hand at home. He would like to know what our office would like to have her on. She is becoming unable to manage he meds. He states that family will been stepping in to help with this. Notes state she was intolerant to both lipitor and crestor and that pt should be taking Zetia 10 mg. Please advise.

## 2021-09-27 NOTE — Telephone Encounter (Signed)
Telephone msg left for Dr. Roque Lias stating that pt was to start Zetia 10 mg daily. Note routed to PCP to notify.

## 2021-09-27 NOTE — Telephone Encounter (Signed)
° °  Dr. Roque Lias called, he requesting to speak with Dr. Ival Bible nurse regarding pt's meds

## 2021-10-17 DIAGNOSIS — I10 Essential (primary) hypertension: Secondary | ICD-10-CM | POA: Diagnosis not present

## 2021-10-17 DIAGNOSIS — Z7982 Long term (current) use of aspirin: Secondary | ICD-10-CM | POA: Diagnosis not present

## 2021-10-17 DIAGNOSIS — Z008 Encounter for other general examination: Secondary | ICD-10-CM | POA: Diagnosis not present

## 2021-10-17 DIAGNOSIS — Z803 Family history of malignant neoplasm of breast: Secondary | ICD-10-CM | POA: Diagnosis not present

## 2021-10-17 DIAGNOSIS — I2510 Atherosclerotic heart disease of native coronary artery without angina pectoris: Secondary | ICD-10-CM | POA: Diagnosis not present

## 2021-10-17 DIAGNOSIS — I252 Old myocardial infarction: Secondary | ICD-10-CM | POA: Diagnosis not present

## 2021-10-17 DIAGNOSIS — Z823 Family history of stroke: Secondary | ICD-10-CM | POA: Diagnosis not present

## 2021-11-06 ENCOUNTER — Other Ambulatory Visit: Payer: Self-pay | Admitting: Cardiology

## 2021-11-06 DIAGNOSIS — I517 Cardiomegaly: Secondary | ICD-10-CM

## 2021-11-06 DIAGNOSIS — I25119 Atherosclerotic heart disease of native coronary artery with unspecified angina pectoris: Secondary | ICD-10-CM

## 2021-11-23 ENCOUNTER — Encounter: Payer: Self-pay | Admitting: Cardiology

## 2021-11-23 ENCOUNTER — Ambulatory Visit: Payer: Medicare HMO | Admitting: Cardiology

## 2021-11-23 VITALS — BP 160/100 | HR 83 | Ht 60.0 in | Wt 103.0 lb

## 2021-11-23 DIAGNOSIS — I5020 Unspecified systolic (congestive) heart failure: Secondary | ICD-10-CM

## 2021-11-23 DIAGNOSIS — T466X5A Adverse effect of antihyperlipidemic and antiarteriosclerotic drugs, initial encounter: Secondary | ICD-10-CM | POA: Diagnosis not present

## 2021-11-23 DIAGNOSIS — I25119 Atherosclerotic heart disease of native coronary artery with unspecified angina pectoris: Secondary | ICD-10-CM

## 2021-11-23 DIAGNOSIS — I255 Ischemic cardiomyopathy: Secondary | ICD-10-CM

## 2021-11-23 DIAGNOSIS — M791 Myalgia, unspecified site: Secondary | ICD-10-CM | POA: Diagnosis not present

## 2021-11-23 MED ORDER — EZETIMIBE 10 MG PO TABS: 10.0000 mg | ORAL_TABLET | Freq: Every day | ORAL | 6 refills | Status: DC

## 2021-11-23 MED ORDER — LISINOPRIL 10 MG PO TABS: 10.0000 mg | ORAL_TABLET | Freq: Every day | ORAL | 3 refills | Status: DC

## 2021-11-23 NOTE — Patient Instructions (Signed)
Medication Instructions:  ? ?INCREASE Lisinopril to 10 mg daily ? ?Take Zetia 10 mg every OTHER day for a couple weeks. If tolerated, go to taking daily ? ? ?Labwork: ?None today ? ?Testing/Procedures: ?None  ? ?Follow-Up: ?3 months ? ?Any Other Special Instructions Will Be Listed Below (If Applicable). ? ?If you need a refill on your cardiac medications before your next appointment, please call your pharmacy. ? ?
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
## 2022-01-14 DIAGNOSIS — I5022 Chronic systolic (congestive) heart failure: Secondary | ICD-10-CM | POA: Diagnosis not present

## 2022-01-14 DIAGNOSIS — E782 Mixed hyperlipidemia: Secondary | ICD-10-CM | POA: Diagnosis not present

## 2022-01-14 DIAGNOSIS — I2510 Atherosclerotic heart disease of native coronary artery without angina pectoris: Secondary | ICD-10-CM | POA: Diagnosis not present

## 2022-01-14 DIAGNOSIS — I110 Hypertensive heart disease with heart failure: Secondary | ICD-10-CM | POA: Diagnosis not present

## 2022-04-02 ENCOUNTER — Ambulatory Visit: Payer: Medicare HMO | Admitting: Cardiology

## 2022-04-09 DIAGNOSIS — E782 Mixed hyperlipidemia: Secondary | ICD-10-CM | POA: Diagnosis not present

## 2022-04-09 DIAGNOSIS — R7301 Impaired fasting glucose: Secondary | ICD-10-CM | POA: Diagnosis not present

## 2022-04-16 ENCOUNTER — Encounter: Payer: Self-pay | Admitting: Internal Medicine

## 2022-04-16 DIAGNOSIS — Z6820 Body mass index (BMI) 20.0-20.9, adult: Secondary | ICD-10-CM | POA: Diagnosis not present

## 2022-04-16 DIAGNOSIS — I5022 Chronic systolic (congestive) heart failure: Secondary | ICD-10-CM | POA: Diagnosis not present

## 2022-04-16 DIAGNOSIS — G720 Drug-induced myopathy: Secondary | ICD-10-CM | POA: Diagnosis not present

## 2022-04-16 DIAGNOSIS — E782 Mixed hyperlipidemia: Secondary | ICD-10-CM | POA: Diagnosis not present

## 2022-04-16 DIAGNOSIS — I10 Essential (primary) hypertension: Secondary | ICD-10-CM | POA: Diagnosis not present

## 2022-04-16 DIAGNOSIS — E875 Hyperkalemia: Secondary | ICD-10-CM | POA: Diagnosis not present

## 2022-04-16 DIAGNOSIS — I255 Ischemic cardiomyopathy: Secondary | ICD-10-CM | POA: Diagnosis not present

## 2022-04-16 DIAGNOSIS — I2510 Atherosclerotic heart disease of native coronary artery without angina pectoris: Secondary | ICD-10-CM | POA: Diagnosis not present

## 2022-04-16 DIAGNOSIS — R7301 Impaired fasting glucose: Secondary | ICD-10-CM | POA: Diagnosis not present

## 2022-05-02 ENCOUNTER — Other Ambulatory Visit: Payer: Self-pay | Admitting: Cardiology

## 2022-05-02 DIAGNOSIS — I517 Cardiomegaly: Secondary | ICD-10-CM

## 2022-05-02 DIAGNOSIS — I25119 Atherosclerotic heart disease of native coronary artery with unspecified angina pectoris: Secondary | ICD-10-CM
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
## 2022-05-23 ENCOUNTER — Encounter: Payer: Self-pay | Admitting: Physician Assistant

## 2022-05-23 ENCOUNTER — Ambulatory Visit: Payer: Medicare HMO | Attending: Cardiology | Admitting: Physician Assistant

## 2022-05-23 VITALS — BP 118/80 | HR 82 | Ht 60.0 in | Wt 103.2 lb

## 2022-05-23 DIAGNOSIS — I10 Essential (primary) hypertension: Secondary | ICD-10-CM

## 2022-05-23 DIAGNOSIS — I5022 Chronic systolic (congestive) heart failure: Secondary | ICD-10-CM | POA: Diagnosis not present

## 2022-05-23 DIAGNOSIS — I255 Ischemic cardiomyopathy: Secondary | ICD-10-CM | POA: Diagnosis not present

## 2022-05-23 DIAGNOSIS — I350 Nonrheumatic aortic (valve) stenosis: Secondary | ICD-10-CM | POA: Diagnosis not present

## 2022-05-23 DIAGNOSIS — I340 Nonrheumatic mitral (valve) insufficiency: Secondary | ICD-10-CM

## 2022-05-23 DIAGNOSIS — I2510 Atherosclerotic heart disease of native coronary artery without angina pectoris: Secondary | ICD-10-CM

## 2022-05-23 DIAGNOSIS — E785 Hyperlipidemia, unspecified: Secondary | ICD-10-CM | POA: Diagnosis not present

## 2022-05-23 NOTE — Patient Instructions (Signed)
Medication Instructions:  Your physician recommends that you continue on your current medications as directed. Please refer to the Current Medication list given to you today.   Labwork: None today  Testing/Procedures: None today  Follow-Up: 6 months  Any Other Special Instructions Will Be Listed Below (If Applicable).      HAPPY BIRTHDAY !!!!!!!     If you need a refill on your cardiac medications before your next appointment, please call your pharmacy.

## 2022-09-12 DIAGNOSIS — R7301 Impaired fasting glucose: Secondary | ICD-10-CM | POA: Diagnosis not present

## 2022-09-12 DIAGNOSIS — E782 Mixed hyperlipidemia: Secondary | ICD-10-CM | POA: Diagnosis not present

## 2022-09-13 DIAGNOSIS — L84 Corns and callosities: Secondary | ICD-10-CM | POA: Insufficient documentation

## 2022-09-13 DIAGNOSIS — I110 Hypertensive heart disease with heart failure: Secondary | ICD-10-CM | POA: Diagnosis not present

## 2022-09-13 DIAGNOSIS — I5022 Chronic systolic (congestive) heart failure: Secondary | ICD-10-CM | POA: Diagnosis not present

## 2022-09-13 DIAGNOSIS — I2510 Atherosclerotic heart disease of native coronary artery without angina pectoris: Secondary | ICD-10-CM | POA: Diagnosis not present

## 2022-09-13 DIAGNOSIS — Z0001 Encounter for general adult medical examination with abnormal findings: Secondary | ICD-10-CM | POA: Diagnosis not present

## 2022-09-13 DIAGNOSIS — E875 Hyperkalemia: Secondary | ICD-10-CM | POA: Diagnosis not present

## 2022-09-13 DIAGNOSIS — I10 Essential (primary) hypertension: Secondary | ICD-10-CM | POA: Diagnosis not present

## 2022-09-13 DIAGNOSIS — E782 Mixed hyperlipidemia: Secondary | ICD-10-CM | POA: Diagnosis not present

## 2022-09-13 DIAGNOSIS — I255 Ischemic cardiomyopathy: Secondary | ICD-10-CM | POA: Diagnosis not present

## 2022-09-13 DIAGNOSIS — G720 Drug-induced myopathy: Secondary | ICD-10-CM | POA: Diagnosis not present

## 2022-09-13 DIAGNOSIS — R7301 Impaired fasting glucose: Secondary | ICD-10-CM | POA: Diagnosis not present

## 2022-09-24 DIAGNOSIS — I10 Essential (primary) hypertension: Secondary | ICD-10-CM | POA: Diagnosis not present

## 2022-09-24 DIAGNOSIS — Z008 Encounter for other general examination: Secondary | ICD-10-CM | POA: Diagnosis not present

## 2022-09-24 DIAGNOSIS — Z823 Family history of stroke: Secondary | ICD-10-CM | POA: Diagnosis not present

## 2022-09-24 DIAGNOSIS — Z803 Family history of malignant neoplasm of breast: Secondary | ICD-10-CM | POA: Diagnosis not present

## 2022-09-24 DIAGNOSIS — I252 Old myocardial infarction: Secondary | ICD-10-CM | POA: Diagnosis not present

## 2022-09-24 DIAGNOSIS — I739 Peripheral vascular disease, unspecified: Secondary | ICD-10-CM | POA: Diagnosis not present

## 2022-09-24 DIAGNOSIS — I2510 Atherosclerotic heart disease of native coronary artery without angina pectoris: Secondary | ICD-10-CM | POA: Diagnosis not present

## 2022-09-27 ENCOUNTER — Telehealth: Payer: Self-pay | Admitting: Cardiology

## 2022-09-27 NOTE — Telephone Encounter (Signed)
Received a call from patrica from [facility] asking if pt's check was received and if she had a balance. Informed her I did not a balance on the chart. She added pt to call and pt asked for a c/b on what the $50.00 was for.

## 2022-10-04 ENCOUNTER — Ambulatory Visit (HOSPITAL_COMMUNITY)
Admission: RE | Admit: 2022-10-04 | Discharge: 2022-10-04 | Disposition: A | Discharge status: Home / Self Care | Payer: Medicare HMO | Source: Ambulatory Visit | Attending: Family Medicine | Admitting: Family Medicine

## 2022-10-04 ENCOUNTER — Other Ambulatory Visit (HOSPITAL_COMMUNITY): Payer: Self-pay | Admitting: Family Medicine

## 2022-10-04 DIAGNOSIS — M25551 Pain in right hip: Secondary | ICD-10-CM | POA: Insufficient documentation

## 2022-10-04 DIAGNOSIS — E782 Mixed hyperlipidemia: Secondary | ICD-10-CM | POA: Diagnosis not present

## 2022-10-08 DIAGNOSIS — I739 Peripheral vascular disease, unspecified: Secondary | ICD-10-CM | POA: Insufficient documentation

## 2022-10-24 ENCOUNTER — Ambulatory Visit: Payer: Medicare HMO | Admitting: Vascular Surgery

## 2022-10-24 ENCOUNTER — Ambulatory Visit (INDEPENDENT_AMBULATORY_CARE_PROVIDER_SITE_OTHER): Payer: Medicare HMO

## 2022-10-24 ENCOUNTER — Other Ambulatory Visit: Payer: Self-pay

## 2022-10-24 ENCOUNTER — Encounter: Payer: Self-pay | Admitting: Vascular Surgery

## 2022-10-24 VITALS — BP 138/81 | HR 80 | Temp 98.4°F | Ht 60.0 in | Wt 104.2 lb

## 2022-10-24 DIAGNOSIS — I739 Peripheral vascular disease, unspecified: Secondary | ICD-10-CM

## 2022-10-24 LAB — VAS US ABI WITH/WO TBI

## 2022-10-24 NOTE — Progress Notes (Signed)
V[facility]in Specialist of [place]  Patient name: [name] MRN: [id number] DOB: 1927-12-14 Sex: female  REASON FOR CONSULT: Evaluation right hip pain and abnormal outpatient screening arterial studies  HPI: [name] is a 89 y.o. female, who is here today for evaluation of lower extremity arterial flow.  She is here with her daughter.  She was initially evaluated for right hip discomfort and underwent outpatient screening studies with the home health nurse through the insurance company.  This suggested potential severe right-sided arterial insufficiency.  She subsequently was placed on Tylenol arthritis and has had a dramatic relief in her back and hip pain and is no longer walking with a walker.  She has no history of claudication type symptoms or lower extremity tissue loss.  She does have a remote history of coronary disease with drug-eluting stent stent 9 years ago  Past Medical History:  Diagnosis Date   Anxiety    Arthritis    Coronary atherosclerosis of native coronary artery    a. 03/31/14 Ant STEMI s/p DES to LAD; 40-50% diagonal 2 sten and 40% LAD sten   Essential hypertension    Hyperlipidemia    Ischemic cardiomyopathy    a. 03/31/14 EF 15-20% w/ antersept/apical akinesis b.  EF 25-30% by echo in 2018 and previously declined ICD placement   Palpitations     Family History  Problem Relation Age of Onset   Stroke Father 46   Stroke Mother 78    SOCIAL HISTORY: Social History   Socioeconomic History   Marital status: Widowed    Spouse name: Not on file   Number of children: 5   Years of education: Not on file   Highest education level: Not on file  Occupational History   Occupation: Retired    Comment: Nurse, adult: RETIRED  Tobacco Use   Smoking status: Never   Smokeless tobacco: Never  Vaping Use   Vaping Use: Never used  Substance and Sexual Activity   Alcohol use: No    Alcohol/week: 0.0 standard  drinks of alcohol   Drug use: No   Sexual activity: Yes    Birth control/protection: Surgical  Other Topics Concern   Not on file  Social History Narrative   Husband has Parkinson's and is not capable of caring for himself   Social Determinants of Health   Financial Resource Strain: Not on file  Food Insecurity: Not on file  Transportation Needs: Not on file  Physical Activity: Not on file  Stress: Not on file  Social Connections: Not on file  Intimate Partner Violence: Not on file    Allergies  Allergen Reactions   Alprazolam Other (See Comments)    "puts me to sleep for days"   Alprazolam    Caffeine Other (See Comments)    "peps me up"   Guaifenesin Er     Current Outpatient Medications  Medication Sig Dispense Refill   acetaminophen (TYLENOL) 650 MG CR tablet Take 650 mg by mouth every 8 (eight) hours as needed for pain.     aspirin EC 81 MG tablet Take 81 mg by mouth every other day.     Calcium-Magnesium-Vitamin D 275-17-001 MG-MG-UNIT TB24 Take by mouth.     carvedilol (COREG) 6.25 MG tablet TAKE (1) TABLET BY MOUTH TWICE A DAY WITH FOOD. 180 tablet 1   lisinopril (ZESTRIL) 10 MG tablet Take 1 tablet (10 mg total) by mouth daily. 90 tablet 3   Multiple  Vitamin (MULTIVITAMIN WITH MINERALS) TABS Take 1 tablet by mouth daily.     NITROSTAT 0.4 MG SL tablet DISSOLVE 1 TABLET UNDER TONGUE EVERY 5 MINUTES UP TO 15 MIN FOR CHESTPAIN. IF NO RELIEF CALL 911. 25 tablet 3   No current facility-administered medications for this visit.    REVIEW OF SYSTEMS:  [X]  denotes positive finding, [ ]  denotes negative finding Cardiac  Comments:  Chest pain or chest pressure:    Shortness of breath upon exertion:    Short of breath when lying flat:    Irregular heart rhythm:        Vascular    Pain in calf, thigh, or hip brought on by ambulation:    Pain in feet at night that wakes you up from your sleep:     Blood clot in your veins:    Leg swelling:         Pulmonary     Oxygen at home:    Productive cough:     Wheezing:         Neurologic    Sudden weakness in arms or legs:     Sudden numbness in arms or legs:     Sudden onset of difficulty speaking or slurred speech:    Temporary loss of vision in one eye:     Problems with dizziness:         Gastrointestinal    Blood in stool:     Vomited blood:         Genitourinary    Burning when urinating:     Blood in urine:        Psychiatric    Major depression:         Hematologic    Bleeding problems:    Problems with blood clotting too easily:        Skin    Rashes or ulcers:        Constitutional    Fever or chills:      PHYSICAL EXAM: Vitals:   10/24/22 0933  BP: 138/81  Pulse: 80  Temp: 98.4 F (36.9 C)  SpO2: 94%  Weight: 104 lb 3.2 oz (47.3 kg)  Height: 5' (1.524 m)    GENERAL: The patient is a well-nourished female, in no acute distress. The vital signs are documented above. CARDIOVASCULAR: Plus radial pulses bilaterally.  2-3+ popliteal pulses bilaterally.  2+ left posterior tibial pulse and 1+ right posterior tibial pulse PULMONARY: There is good air exchange  MUSCULOSKELETAL: There are no major deformities or cyanosis. NEUROLOGIC: No focal weakness or paresthesias are detected. SKIN: There are no ulcers or rashes noted. PSYCHIATRIC: The patient has a normal affect.  DATA:  Noninvasive studies today reveal calcified vessels therefore ankle arm indices unreliable.  She has slightly diminished toe brachial indices.  MEDICAL ISSUES: I discussed these findings at length with the patient and her daughter.  By physical exam she does not have any evidence of significant flow limitation to her lower extremities.  I did explain that she does have medial calcification of her arteries making these noninvasive studies unreliable.  She was reassured with this discussion and will see [place] again on an as-needed basis   Rosetta Posner, MD [facility] Specialists of  [facility] [phone] Pager [phone]  Note: Portions of this report may have been transcribed using voice recognition software.  Every effort has been made to ensure accuracy; however, inadvertent computerized transcription errors may still be  present.

## 2022-10-31 ENCOUNTER — Other Ambulatory Visit: Payer: Self-pay | Admitting: Cardiology

## 2022-10-31 DIAGNOSIS — I517 Cardiomegaly: Secondary | ICD-10-CM

## 2022-10-31 DIAGNOSIS — I25119 Atherosclerotic heart disease of native coronary artery with unspecified angina pectoris: Secondary | ICD-10-CM

## 2022-11-15 ENCOUNTER — Encounter: Payer: Self-pay | Admitting: Radiology

## 2022-11-19 ENCOUNTER — Encounter: Payer: Self-pay | Admitting: Cardiology

## 2022-11-27 NOTE — Progress Notes (Unsigned)
    Cardiology Office Note  Date: 11/28/2022   ID: NORRINE BALLESTER, DOB 07/16/1928, MRN [id number]  History of Present Illness: [name] is a 89 y.o. female last seen in September 2023 by Ms. Dunn PA-C, I reviewed the note.  She is here with her daughter for a follow-up visit.  Reports no angina or interval nitroglycerin use.  Stable NYHA class II dyspnea, no palpitations or syncope.  She still lives in her own home, has someone stay with her at nighttime.  We went over her medications today.  Discussed retrial of Zetia, she has known statin myalgias and only tried Zetia for a few days before stopping it.  Her most recent LDL was 113 in December 2023.  Also tolerating aspirin 81 mg every other day with less bruising.  Physical Exam: VS:  BP 124/68   Pulse 74   Ht 5\' 1"  (1.549 m)   Wt 103 lb (46.7 kg)   SpO2 96%   BMI 19.46 kg/m , BMI Body mass index is 19.46 kg/m.  Wt Readings from Last 3 Encounters:  11/28/22 103 lb (46.7 kg)  10/24/22 104 lb 3.2 oz (47.3 kg)  05/23/22 103 lb 3.2 oz (46.8 kg)    General: Patient appears comfortable at rest. HEENT: Conjunctiva and lids normal. Neck: Supple, no elevated JVP or carotid bruits. Lungs: Clear to auscultation, nonlabored breathing at rest. Cardiac: Regular rate and rhythm, no S3 or significant systolic murmur. Extremities: No pitting edema.  ECG:  An ECG dated 05/23/2022 was personally reviewed today and demonstrated:  Sinus rhythm last probable old anteroseptal infarct pattern and nonspecific ST-T wave changes.  Labwork:  December 2023: Hemoglobin 15.3, platelets 202, BUN 23, creatinine 0.81, potassium 5.3, AST 19, ALT 15, cholesterol 197, triglycerides 127, HDL 62, LDL 113, hemoglobin A1c 5.9%  Other Studies Reviewed Today:  No interval cardiac testing for review today.  Assessment and Plan:  1.  CAD status post anterior infarct with DES to the LAD in 2015.  Continues on aspirin, Coreg, lisinopril, and as needed nitroglycerin.   Continue with observation in the absence of worsening angina.  2.  HFmrEF with ischemic cardiomyopathy, LVEF 40 to 45% by echocardiogram in September 2022.  Regimen includes Coreg and lisinopril.  Has not required standing diuretic.  Not good candidate for MRA given high normal potassium level at baseline and advanced age.  She remains comfortable without major medication changes, we have discussed other options in terms of GDMT over time.  Remains hemodynamically stable and without evidence of fluid excess.  3.  Mixed hyperlipidemia with statin myalgias.  Last LDL 113 in December 2023.  We will try Zetia 10 mg daily once again and see if she tolerates this (had only tried it for a few days previously).  Disposition:  Follow up  6 months.  Signed, Satira Sark, M.D., F.A.C.C.

## 2022-11-28 ENCOUNTER — Encounter: Payer: Self-pay | Admitting: Cardiology

## 2022-11-28 ENCOUNTER — Ambulatory Visit: Payer: Medicare HMO | Attending: Cardiology | Admitting: Cardiology

## 2022-11-28 VITALS — BP 124/68 | HR 74 | Ht 61.0 in | Wt 103.0 lb

## 2022-11-28 DIAGNOSIS — T466X5A Adverse effect of antihyperlipidemic and antiarteriosclerotic drugs, initial encounter: Secondary | ICD-10-CM

## 2022-11-28 DIAGNOSIS — I5022 Chronic systolic (congestive) heart failure: Secondary | ICD-10-CM

## 2022-11-28 DIAGNOSIS — T466X5D Adverse effect of antihyperlipidemic and antiarteriosclerotic drugs, subsequent encounter: Secondary | ICD-10-CM | POA: Diagnosis not present

## 2022-11-28 DIAGNOSIS — M791 Myalgia, unspecified site: Secondary | ICD-10-CM | POA: Diagnosis not present

## 2022-11-28 DIAGNOSIS — I25119 Atherosclerotic heart disease of native coronary artery with unspecified angina pectoris: Secondary | ICD-10-CM

## 2022-11-28 DIAGNOSIS — E782 Mixed hyperlipidemia: Secondary | ICD-10-CM

## 2022-11-28 MED ORDER — EZETIMIBE 10 MG PO TABS: 10.0000 mg | ORAL_TABLET | Freq: Every day | ORAL | 3 refills | Status: DC

## 2022-11-28 NOTE — Patient Instructions (Signed)
Medication Instructions:  START Zetia 10 mg daily   Labwork: None today  Testing/Procedures: None today  Follow-Up: 6 months  Any Other Special Instructions Will Be Listed Below (If Applicable).  If you need a refill on your cardiac medications before your next appointment, please call your pharmacy.  

## 2022-12-29 ENCOUNTER — Other Ambulatory Visit: Payer: Self-pay | Admitting: Cardiology

## 2022-12-31 ENCOUNTER — Other Ambulatory Visit: Payer: Self-pay | Admitting: Cardiology

## 2023-01-31 ENCOUNTER — Other Ambulatory Visit: Payer: Self-pay | Admitting: Cardiology

## 2023-01-31 DIAGNOSIS — I25119 Atherosclerotic heart disease of native coronary artery with unspecified angina pectoris: Secondary | ICD-10-CM

## 2023-01-31 DIAGNOSIS — I517 Cardiomegaly: Secondary | ICD-10-CM

## 2023-04-03 DIAGNOSIS — E782 Mixed hyperlipidemia: Secondary | ICD-10-CM | POA: Diagnosis not present

## 2023-04-03 DIAGNOSIS — R7301 Impaired fasting glucose: Secondary | ICD-10-CM | POA: Diagnosis not present

## 2023-04-08 ENCOUNTER — Encounter: Payer: Self-pay | Admitting: Internal Medicine

## 2023-04-08 DIAGNOSIS — G720 Drug-induced myopathy: Secondary | ICD-10-CM | POA: Diagnosis not present

## 2023-04-08 DIAGNOSIS — I10 Essential (primary) hypertension: Secondary | ICD-10-CM | POA: Diagnosis not present

## 2023-04-08 DIAGNOSIS — I5022 Chronic systolic (congestive) heart failure: Secondary | ICD-10-CM | POA: Diagnosis not present

## 2023-04-08 DIAGNOSIS — E875 Hyperkalemia: Secondary | ICD-10-CM | POA: Diagnosis not present

## 2023-04-08 DIAGNOSIS — E782 Mixed hyperlipidemia: Secondary | ICD-10-CM | POA: Diagnosis not present

## 2023-04-08 DIAGNOSIS — I255 Ischemic cardiomyopathy: Secondary | ICD-10-CM | POA: Diagnosis not present

## 2023-04-08 DIAGNOSIS — I2510 Atherosclerotic heart disease of native coronary artery without angina pectoris: Secondary | ICD-10-CM | POA: Diagnosis not present

## 2023-04-08 DIAGNOSIS — R7301 Impaired fasting glucose: Secondary | ICD-10-CM | POA: Diagnosis not present

## 2023-04-08 DIAGNOSIS — Z0000 Encounter for general adult medical examination without abnormal findings: Secondary | ICD-10-CM | POA: Diagnosis not present

## 2023-04-29 ENCOUNTER — Other Ambulatory Visit: Payer: Self-pay | Admitting: Cardiology

## 2023-04-29 DIAGNOSIS — I25119 Atherosclerotic heart disease of native coronary artery with unspecified angina pectoris: Secondary | ICD-10-CM

## 2023-04-29 DIAGNOSIS — I517 Cardiomegaly: Secondary | ICD-10-CM

## 2023-05-24 ENCOUNTER — Encounter: Payer: Self-pay | Admitting: Cardiology

## 2023-06-05 ENCOUNTER — Encounter: Payer: Self-pay | Admitting: Cardiology

## 2023-06-05 ENCOUNTER — Ambulatory Visit: Payer: Medicare HMO | Attending: Cardiology | Admitting: Cardiology

## 2023-06-05 VITALS — BP 132/80 | HR 84 | Ht 60.0 in | Wt 103.0 lb

## 2023-06-05 DIAGNOSIS — I5022 Chronic systolic (congestive) heart failure: Secondary | ICD-10-CM | POA: Diagnosis not present

## 2023-06-05 DIAGNOSIS — E782 Mixed hyperlipidemia: Secondary | ICD-10-CM | POA: Diagnosis not present

## 2023-06-05 DIAGNOSIS — I25119 Atherosclerotic heart disease of native coronary artery with unspecified angina pectoris: Secondary | ICD-10-CM | POA: Diagnosis not present

## 2023-06-05 NOTE — Patient Instructions (Signed)
Medication Instructions:  Your physician recommends that you continue on your current medications as directed. Please refer to the Current Medication list given to you today.   Labwork: None today  Testing/Procedures: None today  Follow-Up: 6 months  Any Other Special Instructions Will Be Listed Below (If Applicable).  If you need a refill on your cardiac medications before your next appointment, please call your pharmacy.  

## 2023-06-05 NOTE — Progress Notes (Signed)
Cardiology Office Note  Date: 06/05/2023   ID: [name], DOB 1928/02/21, MRN [id number]  History of Present Illness: [name] is a 89 y.o. female last seen in March.  She is here today for a follow-up visit.  She does not report any obvious change in status, no angina or nitroglycerin use.  Still lives in her own home, has Meals on Wheels, also has someone stay with her sometimes during the night.  She is not driving at this time.  Does not report any palpitations or sudden syncope.  I reviewed her medications.  She is tolerating current regimen.  Lab work from July also reviewed.  ECG today shows sinus rhythm with PVC, prior anteroseptal infarct pattern, nonspecific ST-T changes.  Physical Exam: VS:  BP 132/80 (BP Location: Right Arm, Patient Position: Sitting, Cuff Size: Normal)   Pulse 84   Ht 5' (1.524 m)   Wt 103 lb (46.7 kg)   SpO2 96%   BMI 20.12 kg/m , BMI Body mass index is 20.12 kg/m.  Wt Readings from Last 3 Encounters:  06/05/23 103 lb (46.7 kg)  11/28/22 103 lb (46.7 kg)  10/24/22 104 lb 3.2 oz (47.3 kg)    General: Patient appears comfortable at rest. HEENT: Conjunctiva and lids normal. Neck: Supple, no elevated JVP or carotid bruits. Lungs: Clear to auscultation. Cardiac: Regular rate and rhythm, no S3, 2/6 systolic murmur. Extremities: No pitting edema.  ECG:  An ECG dated 05/23/2022 was personally reviewed today and demonstrated:  Sinus rhythm with probable old anteroseptal infarct pattern and nonspecific ST-T changes.  Labwork:  July 2024: Hemoglobin 15.8, platelets 211, BUN 22, creatinine 0.78, potassium 5.1, AST 19, ALT 14, cholesterol 202, triglycerides 152, HDL 61, LDL 114, hemoglobin A1c 5.9%  Other Studies Reviewed Today:  No interval cardiac testing for review today.  Assessment and Plan:  1.  CAD status post anterior infarct with DES to the LAD in 2015.  No active angina at current level of activity.  ECG reviewed.  Continue aspirin,  Zetia, and as needed nitroglycerin.   2.  HFmrEF with ischemic cardiomyopathy, LVEF 40 to 45% by echocardiogram in September 2022.  Regimen includes Coreg and lisinopril.  Has not required standing diuretic.  Not good candidate for MRA given high normal potassium level at baseline and advanced age.   3.  Mixed hyperlipidemia with statin myalgias.  LDL 114 in July.  She is tolerating Zetia at this point.  Disposition:  Follow up  6 months.  Signed, Jonelle Sidle, M.D., F.A.C.C. [facility]

## 2023-06-23 ENCOUNTER — Encounter: Payer: Self-pay | Admitting: Emergency Medicine

## 2023-06-23 ENCOUNTER — Ambulatory Visit
Admission: EM | Admit: 2023-06-23 | Discharge: 2023-06-23 | Disposition: A | Discharge status: Home / Self Care | Payer: Medicare HMO | Attending: Family Medicine | Admitting: Family Medicine

## 2023-06-23 ENCOUNTER — Other Ambulatory Visit: Payer: Self-pay

## 2023-06-23 DIAGNOSIS — L03113 Cellulitis of right upper limb: Secondary | ICD-10-CM

## 2023-06-23 MED ORDER — CEPHALEXIN 500 MG PO CAPS: 500.0000 mg | ORAL_CAPSULE | Freq: Two times a day (BID) | ORAL | 0 refills | Status: DC

## 2023-06-23 NOTE — Discharge Instructions (Signed)
It appears that you may have an infected bug bite or something similar on top of the mass of your elbow.  I have sent a course of antibiotics and you may do warm compresses, Neosporin to the area if it opens up.  Follow-up with your primary care provider for a recheck, particularly if not improving

## 2023-06-23 NOTE — ED Triage Notes (Signed)
Pt family reports pt has had swelling to right elbow for years but reports right site had increased in redness for last several days and wanted site assessed for possible infection. Denies any known injury/fall.
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
## 2023-07-01 ENCOUNTER — Other Ambulatory Visit: Payer: Self-pay | Admitting: Cardiology

## 2023-07-04 DIAGNOSIS — Z6820 Body mass index (BMI) 20.0-20.9, adult: Secondary | ICD-10-CM | POA: Diagnosis not present

## 2023-07-04 DIAGNOSIS — I10 Essential (primary) hypertension: Secondary | ICD-10-CM | POA: Diagnosis not present

## 2023-07-04 DIAGNOSIS — L0293 Carbuncle, unspecified: Secondary | ICD-10-CM | POA: Diagnosis not present

## 2023-07-04 DIAGNOSIS — I5022 Chronic systolic (congestive) heart failure: Secondary | ICD-10-CM | POA: Diagnosis not present

## 2023-07-04 DIAGNOSIS — Z713 Dietary counseling and surveillance: Secondary | ICD-10-CM | POA: Diagnosis not present

## 2023-07-04 DIAGNOSIS — I110 Hypertensive heart disease with heart failure: Secondary | ICD-10-CM | POA: Diagnosis not present

## 2023-07-04 DIAGNOSIS — L02433 Carbuncle of right upper limb: Secondary | ICD-10-CM | POA: Diagnosis not present

## 2023-08-08 DIAGNOSIS — X32XXXA Exposure to sunlight, initial encounter: Secondary | ICD-10-CM | POA: Diagnosis not present

## 2023-08-08 DIAGNOSIS — L820 Inflamed seborrheic keratosis: Secondary | ICD-10-CM | POA: Diagnosis not present

## 2023-08-08 DIAGNOSIS — L570 Actinic keratosis: Secondary | ICD-10-CM | POA: Diagnosis not present

## 2023-08-08 DIAGNOSIS — D225 Melanocytic nevi of trunk: Secondary | ICD-10-CM | POA: Diagnosis not present

## 2023-09-19 ENCOUNTER — Other Ambulatory Visit: Payer: Self-pay | Admitting: Cardiology

## 2023-10-14 DIAGNOSIS — E782 Mixed hyperlipidemia: Secondary | ICD-10-CM | POA: Diagnosis not present

## 2023-10-14 DIAGNOSIS — R7301 Impaired fasting glucose: Secondary | ICD-10-CM | POA: Diagnosis not present

## 2023-10-16 DIAGNOSIS — I5022 Chronic systolic (congestive) heart failure: Secondary | ICD-10-CM | POA: Diagnosis not present

## 2023-10-16 DIAGNOSIS — I10 Essential (primary) hypertension: Secondary | ICD-10-CM | POA: Diagnosis not present

## 2023-10-16 DIAGNOSIS — E782 Mixed hyperlipidemia: Secondary | ICD-10-CM | POA: Diagnosis not present

## 2023-10-16 DIAGNOSIS — I2510 Atherosclerotic heart disease of native coronary artery without angina pectoris: Secondary | ICD-10-CM | POA: Diagnosis not present

## 2023-10-16 DIAGNOSIS — G720 Drug-induced myopathy: Secondary | ICD-10-CM | POA: Diagnosis not present

## 2023-10-16 DIAGNOSIS — I110 Hypertensive heart disease with heart failure: Secondary | ICD-10-CM | POA: Diagnosis not present

## 2023-10-16 DIAGNOSIS — G3184 Mild cognitive impairment, so stated: Secondary | ICD-10-CM | POA: Insufficient documentation

## 2023-10-16 DIAGNOSIS — H9190 Unspecified hearing loss, unspecified ear: Secondary | ICD-10-CM | POA: Insufficient documentation

## 2023-10-16 DIAGNOSIS — Z79899 Other long term (current) drug therapy: Secondary | ICD-10-CM | POA: Diagnosis not present

## 2023-10-16 DIAGNOSIS — E875 Hyperkalemia: Secondary | ICD-10-CM | POA: Diagnosis not present

## 2023-10-16 DIAGNOSIS — R7301 Impaired fasting glucose: Secondary | ICD-10-CM | POA: Diagnosis not present

## 2023-10-16 DIAGNOSIS — I255 Ischemic cardiomyopathy: Secondary | ICD-10-CM | POA: Diagnosis not present

## 2023-10-18 ENCOUNTER — Other Ambulatory Visit: Payer: Self-pay | Admitting: Cardiology

## 2023-10-18 DIAGNOSIS — I25119 Atherosclerotic heart disease of native coronary artery with unspecified angina pectoris: Secondary | ICD-10-CM

## 2023-10-18 DIAGNOSIS — I517 Cardiomegaly: Secondary | ICD-10-CM

## 2023-10-31 ENCOUNTER — Encounter (INDEPENDENT_AMBULATORY_CARE_PROVIDER_SITE_OTHER): Payer: Self-pay | Admitting: Otolaryngology

## 2023-11-12 ENCOUNTER — Encounter (INDEPENDENT_AMBULATORY_CARE_PROVIDER_SITE_OTHER): Payer: Self-pay | Admitting: Otolaryngology

## 2023-12-06 ENCOUNTER — Encounter: Payer: Self-pay | Admitting: Student

## 2023-12-06 ENCOUNTER — Ambulatory Visit: Payer: Medicare HMO | Attending: Student | Admitting: Student

## 2023-12-06 VITALS — BP 146/78 | HR 80 | Ht 60.0 in | Wt 102.0 lb

## 2023-12-06 DIAGNOSIS — I10 Essential (primary) hypertension: Secondary | ICD-10-CM

## 2023-12-06 DIAGNOSIS — Z79899 Other long term (current) drug therapy: Secondary | ICD-10-CM

## 2023-12-06 DIAGNOSIS — I350 Nonrheumatic aortic (valve) stenosis: Secondary | ICD-10-CM

## 2023-12-06 DIAGNOSIS — I2510 Atherosclerotic heart disease of native coronary artery without angina pectoris: Secondary | ICD-10-CM

## 2023-12-06 DIAGNOSIS — E785 Hyperlipidemia, unspecified: Secondary | ICD-10-CM

## 2023-12-06 DIAGNOSIS — I5022 Chronic systolic (congestive) heart failure: Secondary | ICD-10-CM | POA: Diagnosis not present

## 2023-12-06 MED ORDER — LISINOPRIL 10 MG PO TABS: 10.0000 mg | ORAL_TABLET | Freq: Every day | ORAL | 1 refills | Status: DC

## 2023-12-06 MED ORDER — EZETIMIBE 10 MG PO TABS: 10.0000 mg | ORAL_TABLET | Freq: Every day | ORAL | 3 refills | Status: DC

## 2023-12-06 NOTE — Progress Notes (Signed)
 Cardiology Office Note    Date:  12/06/2023  ID:  Terri, [name] [birth year], MRN [id number] Cardiologist: Nona Dell, MD    History of Present Illness:    [name] is a 89 y.o. female with past medical history of CAD (s/p DES to LAD in 03/2014), HFmrEF/ICM (EF 15-20% in 2015, at 25-30% by echo in 2018 and previously declined ICD placement, EF at 40-45% by echo in 05/2021), HTN, HLD and aortic stenosis who presents to the office today for 56-month follow-up.  She was last examined by Dr. Diona Browner in 05/2023 and was overall doing well at that time. She was continued on her current cardiac medications with ASA 81mg  daily, Coreg 6.25mg  BID, Zetia 10mg  daily and Lisinopril 10mg  daily.   In talking with the patient and her daughter today, the patient reports she has overall been feeling well.  Her biggest issue recently has been memory changes. Her daughter is concerned this is due to vascular dementia and has been encouraging her to take Zetia but the patient has declined. We reviewed indications for this again today and she is open to a trial of it. Previously had significant myalgias with statins. They have reviewed her memory issues with her PCP as well but not currently on medical therapy for this. She is mostly homebound and has a caregiver or family that stays with her at night. Performs ADL's independently. She denies any specific dyspnea on exertion, chest pain, palpitations, orthopnea, PND or pitting edema. In reviewing her medications, she has only been taking Lisinopril 5 mg daily as she wanted to reduce her medications.  Studies Reviewed:   EKG: EKG is not ordered today.  Echocardiogram: 05/2021 IMPRESSIONS     1. Left ventricular ejection fraction, by estimation, is 40 to 45%. The  left ventricle has mildly decreased function. The left ventricle  demonstrates regional wall motion abnormalities (see scoring  diagram/findings for description). There is mild left  ventricular  hypertrophy. Left ventricular diastolic parameters are  consistent with Grade I diastolic dysfunction (impaired relaxation).   2. Right ventricular systolic function is normal. The right ventricular  size is normal. There is normal pulmonary artery systolic pressure. The  estimated right ventricular systolic pressure is 18.5 mmHg.   3. Left atrial size was mildly dilated.   4. The mitral valve is abnormal. Mild mitral valve regurgitation.   5. The aortic valve is tricuspid. There is mild calcification of the  aortic valve. Aortic valve regurgitation is not visualized. Mild aortic  valve stenosis. Aortic valve area, by VTI measures 1.44 cm. Aortic valve  mean gradient measures 8.0 mmHg.   6. The inferior vena cava is normal in size with greater than 50%  respiratory variability, suggesting right atrial pressure of 3 mmHg.   Comparison(s): Prior images unable to be directly viewed.     Risk Assessment/Calculations:     HYPERTENSION CONTROL Vitals:   12/06/23 1308 12/06/23 1333  BP: (!) 150/80 (!) 146/78    The patient's blood pressure is elevated above target today.  In order to address the patient's elevated BP: A current anti-hypertensive medication was adjusted today.       Physical Exam:   VS:  BP (!) 146/78   Pulse 80   Ht 5' (1.524 m)   Wt 102 lb (46.3 kg)   SpO2 96%   BMI 19.92 kg/m    Wt Readings from Last 3 Encounters:  12/06/23 102 lb (46.3 kg)  06/05/23 103 lb (46.7  kg)  11/28/22 103 lb (46.7 kg)     GEN: Pleasant, elderly female appearing in no acute distress NECK: No JVD; No carotid bruits CARDIAC: RRR, 2/6 systolic murmur along sternal border. RESPIRATORY:  Clear to auscultation without rales, wheezing or rhonchi  ABDOMEN: Appears non-distended. No obvious abdominal masses. EXTREMITIES: No clubbing or cyanosis. No pitting edema.  Distal pedal pulses are 2+ bilaterally.   Assessment and Plan:   1. CAD/HLD - She is s/p DES to LAD in 03/2014. Her  activity is limited given her age but she denies any recent anginal symptoms. Through mutual discussion with the patient and her daughter, she will retry Zetia 10 mg daily. If unable to tolerate this, options would be limited as she was previously intolerant to statins and unsure of the utility of PCSK9 inhibitors given her age. Continue ASA 81 mg daily and Coreg 6.25 mg twice daily.  2. Chronic HFmrEF/ICM  - Her EF was as low as 15-20% in 2015, at 25-30% by echo in 2018 and previously declined ICD placement. EF was at 40-45% by echo in 05/2021. She appears euvolemic by examination today and denies any recent respiratory issues.   - Continue Coreg 6.25 mg twice daily. Would titrate Lisinopril to her prior dose of 10 mg daily given her elevated BP. She has not been on Spironolactone given her potassium has been at the higher end of normal. Not currently on an SGLT2 inhibitor as she has preferred a simplified medication regimen.  3. HTN - BP was initially recorded at 150/80 during today's visit, rechecked and at 146/78. I encouraged her to resume Lisinopril at 10 mg daily since she was previously overall tolerating this well. Will recheck a BMET in 2 weeks. Continue Coreg 6.25 mg twice daily.  4. Aortic Stenosis - This was mild by echocardiogram in 05/2021. Anticipate repeat imaging only if she develops symptoms given her age.  Signed, Ellsworth Lennox, PA-C

## 2023-12-06 NOTE — Patient Instructions (Addendum)
 Medication Instructions:   Take Lisinopril 10 mg daily for blood pressure  Try taking Zetia 10 mg daily for cholesterol   Labwork: BMET in 2 weeks at [facility]  Testing/Procedures: None today  Follow-Up: 6 months with Dr.McDowell  Any Other Special Instructions Will Be Listed Below (If Applicable).  If you need a refill on your cardiac medications before your next appointment, please call your pharmacy.

## 2023-12-29 ENCOUNTER — Emergency Department (HOSPITAL_COMMUNITY)
Admission: EM | Admit: 2023-12-29 | Discharge: 2023-12-29 | Disposition: A | Discharge status: Home / Self Care | Attending: Emergency Medicine | Admitting: Emergency Medicine

## 2023-12-29 ENCOUNTER — Encounter (HOSPITAL_COMMUNITY): Payer: Self-pay | Admitting: *Deleted

## 2023-12-29 ENCOUNTER — Other Ambulatory Visit: Payer: Self-pay

## 2023-12-29 ENCOUNTER — Emergency Department (HOSPITAL_COMMUNITY)

## 2023-12-29 DIAGNOSIS — N281 Cyst of kidney, acquired: Secondary | ICD-10-CM | POA: Insufficient documentation

## 2023-12-29 DIAGNOSIS — S32030A Wedge compression fracture of third lumbar vertebra, initial encounter for closed fracture: Secondary | ICD-10-CM | POA: Insufficient documentation

## 2023-12-29 DIAGNOSIS — I10 Essential (primary) hypertension: Secondary | ICD-10-CM | POA: Diagnosis not present

## 2023-12-29 DIAGNOSIS — K5730 Diverticulosis of large intestine without perforation or abscess without bleeding: Secondary | ICD-10-CM | POA: Insufficient documentation

## 2023-12-29 DIAGNOSIS — I700 Atherosclerosis of aorta: Secondary | ICD-10-CM | POA: Diagnosis not present

## 2023-12-29 DIAGNOSIS — X58XXXA Exposure to other specified factors, initial encounter: Secondary | ICD-10-CM | POA: Diagnosis not present

## 2023-12-29 DIAGNOSIS — Z7982 Long term (current) use of aspirin: Secondary | ICD-10-CM | POA: Insufficient documentation

## 2023-12-29 DIAGNOSIS — Z79899 Other long term (current) drug therapy: Secondary | ICD-10-CM | POA: Diagnosis not present

## 2023-12-29 DIAGNOSIS — M545 Low back pain, unspecified: Secondary | ICD-10-CM | POA: Diagnosis present

## 2023-12-29 LAB — CBC WITH DIFFERENTIAL/PLATELET
Abs Immature Granulocytes: 0.03 10*3/uL (ref 0.00–0.07)
Basophils Absolute: 0 10*3/uL (ref 0.0–0.1)
Basophils Relative: 0 %
Eosinophils Absolute: 0.3 10*3/uL (ref 0.0–0.5)
Eosinophils Relative: 4 %
HCT: 43.6 % (ref 36.0–46.0)
Hemoglobin: 14.8 g/dL (ref 12.0–15.0)
Immature Granulocytes: 0 %
Lymphocytes Relative: 22 %
Lymphs Abs: 1.7 10*3/uL (ref 0.7–4.0)
MCH: 31.6 pg (ref 26.0–34.0)
MCHC: 33.9 g/dL (ref 30.0–36.0)
MCV: 93 fL (ref 80.0–100.0)
Monocytes Absolute: 0.8 10*3/uL (ref 0.1–1.0)
Monocytes Relative: 11 %
Neutro Abs: 4.7 10*3/uL (ref 1.7–7.7)
Neutrophils Relative %: 63 %
Platelets: 243 10*3/uL (ref 150–400)
RBC: 4.69 MIL/uL (ref 3.87–5.11)
RDW: 12.4 % (ref 11.5–15.5)
WBC: 7.6 10*3/uL (ref 4.0–10.5)
nRBC: 0 % (ref 0.0–0.2)

## 2023-12-29 LAB — COMPREHENSIVE METABOLIC PANEL WITH GFR
ALT: 17 U/L (ref 0–44)
AST: 18 U/L (ref 15–41)
Albumin: 3.2 g/dL — ABNORMAL LOW (ref 3.5–5.0)
Alkaline Phosphatase: 54 U/L (ref 38–126)
Anion gap: 10 (ref 5–15)
BUN: 22 mg/dL (ref 8–23)
CO2: 24 mmol/L (ref 22–32)
Calcium: 9.3 mg/dL (ref 8.9–10.3)
Chloride: 98 mmol/L (ref 98–111)
Creatinine, Ser: 0.61 mg/dL (ref 0.44–1.00)
GFR, Estimated: >60 mL/min (ref >60)
Glucose, Bld: 100 mg/dL — ABNORMAL HIGH (ref 70–99)
Potassium: 4.3 mmol/L (ref 3.5–5.1)
Sodium: 132 mmol/L — ABNORMAL LOW (ref 135–145)
Total Bilirubin: 0.4 mg/dL (ref 0.0–1.2)
Total Protein: 6.1 g/dL — ABNORMAL LOW (ref 6.5–8.1)

## 2023-12-29 MED ORDER — ACETAMINOPHEN 325 MG PO TABS
650.0000 mg | ORAL_TABLET | Freq: Once | ORAL | Status: AC
  Administered 2023-12-29: 650 mg via ORAL
  Filled 2023-12-29: qty 2

## 2023-12-29 MED ORDER — CARVEDILOL 3.125 MG PO TABS
6.2500 mg | ORAL_TABLET | Freq: Once | ORAL | Status: AC
  Administered 2023-12-29: 6.25 mg via ORAL
  Filled 2023-12-29: qty 2

## 2023-12-29 MED ORDER — IOHEXOL 9 MG/ML PO SOLN
ORAL | Status: AC
Start: 2023-12-29 — End: ?
  Filled 2023-12-29: qty 1000

## 2023-12-29 MED ORDER — LACTATED RINGERS IV BOLUS
500.0000 mL | Freq: Once | INTRAVENOUS | Status: AC
  Administered 2023-12-29: 500 mL via INTRAVENOUS

## 2023-12-29 MED ORDER — IOHEXOL 300 MG/ML  SOLN
100.0000 mL | Freq: Once | INTRAMUSCULAR | Status: AC | PRN
  Administered 2023-12-29: 100 mL via INTRAVENOUS

## 2023-12-29 MED ORDER — IOHEXOL 9 MG/ML PO SOLN
500.0000 mL | ORAL | Status: AC
  Administered 2023-12-29 (×2): 500 mL via ORAL

## 2023-12-29 NOTE — ED Triage Notes (Signed)
 Pt with lower back pain for a week. Denies any injury.  Hx of pain in the past.

## 2023-12-29 NOTE — ED Notes (Signed)
 [facility] paged for stat brace to ER

## 2023-12-29 NOTE — Discharge Instructions (Addendum)
 Please follow-up closely with urology regarding the renal cyst.  Please follow-up closely with neurosurgery regarding the compression fracture.  Please wear the back brace when you are up and moving around.  Return to emergency department immediately for any new or worsening symptoms.

## 2023-12-29 NOTE — ED Provider Notes (Signed)
  Physical Exam  BP (!) 176/90   Pulse 80   Temp 97.6 F (36.4 C) (Oral)   Resp 16   Ht 5' (1.524 m)   Wt 45.4 kg   SpO2 92%   BMI 19.53 kg/m   Physical Exam  Procedures  Procedures  ED Course / MDM    Medical Decision Making Amount and/or Complexity of Data Reviewed Labs: ordered. Radiology: ordered.  Risk OTC drugs. Prescription drug management.   Patient was signed out to myself at shift change.  Patient is doing well at this time and pain is controlled.  Did discuss findings of the renal cyst with Dr. Secundino Dach with urology who does agree the patient can follow-up outpatient with urology for further evaluation.  The abnormality noted within the colon on the lumbar CT was not noted on the CT scan of the abdomen and pelvis.  Patient will be placed in an LSO brace for the L3 compression fracture.  Patient's daughter is at the bedside and is in agreement for plan for discharge home at this time.  Will recommend close follow-up with urology and neurosurgery.  Strict return precautions were discussed for any new or worsening symptoms.  Patient and daughter voiced understanding and had no additional questions.       Roselynn Connors, PA-C 12/29/23 2150    Cheyenne Cotta, MD 12/30/23 1013

## 2023-12-29 NOTE — ED Provider Notes (Signed)
 [place] EMERGENCY DEPARTMENT AT [facility] Provider Note   CSN: [id number] Arrival date & time: 12/29/23  1343     History  Chief Complaint  Patient presents with   Back Pain    [name] is a 89 y.o. female. Has PMH of hypertension, high cholesterol, ischemic cardiomyopathy.  She resents the ER today for evaluation of low back pain x 1 week.  It hurts when she bends over or walks, does not radiate and is in the right lumbar area.  She has had this in the past, had a similar episode last year that was controlled with extra strength Tylenol.  Patient has been occasionally taking Tylenol but she states she does not like to take medication.  Has no abdominal pain, no chest pain, no dizziness or syncope, no shortness of breath.  No saddle anesthesia or paresthesia, no bowel or bladder incontinence.  She normally ambulates without assistance, has been using a walker the past week  Back Pain      Home Medications Prior to Admission medications   Medication Sig Start Date End Date Taking? Authorizing Provider  acetaminophen (TYLENOL) 650 MG CR tablet Take 650 mg by mouth every 8 (eight) hours as needed for pain.    [provider]  aspirin EC 81 MG tablet Take 81 mg by mouth every other day. Swallow whole.    [provider]  Calcium-Magnesium-Vitamin D 600-40-500 MG-MG-UNIT TB24 Take by mouth.    [provider]  carvedilol (COREG) 6.25 MG tablet TAKE (1) TABLET BY MOUTH TWICE A DAY WITH FOOD. 10/18/23   Gerard Knight, MD  ezetimibe (ZETIA) 10 MG tablet Take 1 tablet (10 mg total) by mouth daily. 12/06/23 03/05/24  Strader, Dimple Francis, PA-C  lisinopril (ZESTRIL) 10 MG tablet Take 1 tablet (10 mg total) by mouth daily. 12/06/23   Strader, Brittany M, PA-C  Multiple Vitamin (MULTIVITAMIN WITH MINERALS) TABS Take 1 tablet by mouth daily.    [provider]  NITROSTAT 0.4 MG SL tablet DISSOLVE 1 TABLET UNDER TONGUE EVERY 5 MINUTES UP TO 15  MIN FOR CHESTPAIN. IF NO RELIEF CALL 911. 10/12/15   Gerard Knight, MD      Allergies    Alprazolam, Alprazolam, Caffeine, and Guaifenesin er    Review of Systems   Review of Systems  Musculoskeletal:  Positive for back pain.    Physical Exam Updated Vital Signs BP (!) 156/88   Pulse 76   Temp 98.3 F (36.8 C) (Oral)   Resp 16   Ht 5' (1.524 m)   Wt 45.4 kg   SpO2 93%   BMI 19.53 kg/m  Physical Exam Vitals and nursing note reviewed.  Constitutional:      General: She is not in acute distress.    Appearance: She is well-developed.  HENT:     Head: Normocephalic and atraumatic.     Mouth/Throat:     Mouth: Mucous membranes are moist.  Eyes:     Conjunctiva/sclera: Conjunctivae normal.  Cardiovascular:     Rate and Rhythm: Normal rate and regular rhythm.     Heart sounds: No murmur heard. Pulmonary:     Effort: Pulmonary effort is normal. No respiratory distress.     Breath sounds: Normal breath sounds.  Abdominal:     Palpations: Abdomen is soft.     Tenderness: There is no abdominal tenderness. There is no right CVA tenderness, left CVA tenderness, guarding or rebound.  Musculoskeletal:  General: No swelling.     Cervical back: Neck supple.     Comments: Mild central lumbar tenderness and right lumbar paraspinous tenderness, no crepitus, no overlying skin changes, no induration,   Skin:    General: Skin is warm and dry.     Capillary Refill: Capillary refill takes less than 2 seconds.  Neurological:     General: No focal deficit present.     Mental Status: She is alert and oriented to person, place, and time.     Motor: No weakness.  Psychiatric:        Mood and Affect: Mood normal.     ED Results / Procedures / Treatments   Labs (all labs ordered are listed, but only abnormal results are displayed) Labs Reviewed  CBC WITH DIFFERENTIAL/PLATELET  COMPREHENSIVE METABOLIC PANEL WITH GFR    EKG None  Radiology CT Lumbar Spine Wo  Contrast Result Date: 12/29/2023 CLINICAL DATA:  Low back pain, increased fracture risk. No known fracture. EXAM: CT LUMBAR SPINE WITHOUT CONTRAST TECHNIQUE: Multidetector CT imaging of the lumbar spine was performed without intravenous contrast administration. Multiplanar CT image reconstructions were also generated. RADIATION DOSE REDUCTION: This exam was performed according to the departmental dose-optimization program which includes automated exposure control, adjustment of the mA and/or kV according to patient size and/or use of iterative reconstruction technique. COMPARISON:  None Available. FINDINGS: Segmentation: 5 lumbar type vertebrae. Alignment: Normal. Vertebrae: There is a compression deformity at T12 with loss of vertebral body height of 40% centrally. There is a Schmorl's node with suspected compression deformity involving the superior endplate of L2 with loss of vertebral body height 30% centrally. There is an acute compression deformity at L3 with loss of vertebral body height of 20%. Paraspinal and other soft tissues: Paravertebral soft tissue fat stranding is noted about the L3 vertebral body suggesting acute fracture. Aortic atherosclerosis. There is a large hypodense structure in the retroperitoneum on the left, only partially visualized on exam. The structure measures 19.2 x 8.7 x 7.3 cm and exerts mass effect on the left kidney with anterior displacement of the left kidney. Renal cysts are noted bilaterally. An ill-defined hypodense structure is seen in the anticipated region of the sigmoid colon on the left measuring 3.7 x 2.2 cm. Hypodense nodules are noted in the adrenal glands bilaterally with attenuation of -3 Hounsfield units, compatible with adenomas, the largest on the right measures 2.6 cm. Disc levels: T12-L1: There are degenerative endplate changes with no significant spinal canal or neural foraminal stenosis. L1-L2: There is a diffuse disc osteophyte complex with no significant  spinal canal or neural foraminal stenosis. L2-L3: There is a diffuse disc osteophyte complex with no significant spinal canal or neural foraminal stenosis. L3-L4: There is diffuse disc osteophyte complex with no significant spinal canal or neural foraminal stenosis. L4-L5: There is a diffuse disc osteophyte complex with a left paracentral component resulting in moderate spinal canal stenosis. Mild facet arthropathy is present. The neural foramina are patent bilaterally. L5-S1: There is a disc osteophyte complex with facet arthropathy resulting in mild-to-moderate spinal canal stenosis. Mild facet arthropathy is present. The neural foramina are patent bilaterally. IMPRESSION: 1. Compression deformity at L3 with surrounding soft tissue fat stranding suggesting acute fracture. 2. Compression deformities at T12 and L2, likely chronic. 3. Mild-to-moderate multilevel degenerative changes. 4. Large fluid structure measuring 19.2 x 8.7 x 7.3 cm in the retroperitoneum on the left resulting in mass effect and anterior displacement of the left kidney. CT is recommended  for further evaluation. 5. Hypodense structure in the presumed sigmoid colon in the left lower quadrant measuring 3.7 x 2.2 cm, concerning for mass. CT is recommended for further evaluation. 6. Bilateral adrenal adenomas. 7. Aortic atherosclerosis. Electronically Signed   By: Wyvonnia Heimlich M.D.   On: 12/29/2023 17:12    Procedures Procedures    Medications Ordered in ED Medications  lactated ringers bolus 500 mL (has no administration in time range)  carvedilol (COREG) tablet 6.25 mg (has no administration in time range)  acetaminophen (TYLENOL) tablet 650 mg (650 mg Oral Given 12/29/23 1552)    ED Course/ Medical Decision Making/ A&P                                 Medical Decision Making This patient presents to the ED for concern of low back pain x 1 week, this involves an extensive number of treatment options, and is a complaint that  carries with it a high risk of complications and morbidity.  The differential diagnosis includes compression fracture, strain, HNP, degenerative disc disease, discitis, paraspinous abscess, ureterolithiasis, pyelonephritis, other   Co morbidities that complicate the patient evaluation :   Cardiomyopathy, hypertension, high cholesterol   Additional history obtained:  Additional history obtained from EMR External records from outside source obtained and reviewed including med list,labs prior notes from cardiology   Lab Tests:  I Ordered, and personally interpreted labs.  The pertinent results include: CMP is pending but CBC shows no anemia, normal platelets, normal white blood cell count   Imaging Studies ordered:  I ordered imaging studies including the lumbar spine which shows acute compression fracture L3, degenerative changes throughout lumbar spine; large fluid-filled mass abutting left kidney and mass in left lower quadrant I independently visualized and interpreted imaging within scope of identifying emergent findings  I agree with the radiologist interpretation     Consultations Obtained:  I requested consultation with the ED allergist Dr. Carolynne Citron who read patient's a CT of lumbar spine,  and discussed lab and imaging findings as well as pertinent plan - they recommend: CT abdomen pelvis with IV and oral contrast   Problem List / ED Course / Critical interventions / Medication management  Has been having 1 week of nontraumatic low back pain, acutely tender in the lumbar spine and right lumbar paraspinous area, no overlying skin changes soft nontender abdomen.  Given degree of pain and needing to newly use a walker I suspected possible fracture, given patient's age there also could be bony metastases as a concern for cause of pain so I ordered a CT lumbar spine which did show an acute compression fracture of L3, some degenerative changes but incidentally noted a large  fluid-filled structure in the retroperitoneum on the left causing mass effect of the left kidney and anterior displacement and also a 3.7 x 2.2 cm likely mass in the presumed sigmoid colon and left lower quadrant.  Consulted with the radiologist who read the CT scan who recommended a CT with IV and oral contrast which was ordered.  Patient's pain was controlled with Tylenol in the ER, family also requesting she get her scheduled cardiac nadolol so she is waiting for an additional CT scan.  They are agreeable to wait for results of the CT scan at this time.  Signed out pending remainder of labs and CT imaging to [employer], PA-C. I ordered medication including tylenol  for pain  Reevaluation of the patient after these medicines showed that the patient improved I have reviewed the patients home medicines and have made adjustments as needed   Social Determinants of Health:  Lives alone       Amount and/or Complexity of Data Reviewed Labs: ordered. Radiology: ordered.  Risk OTC drugs. Prescription drug management.           Final Clinical Impression(s) / ED Diagnoses Final diagnoses:  Closed compression fracture of L3 lumbar vertebra, initial encounter [facility])    Rx / DC Orders ED Discharge Orders     None         [name] 12/29/23 1844    Cheyenne Cotta, MD 12/30/23 1013

## 2024-01-28 ENCOUNTER — Ambulatory Visit: Admitting: Urology

## 2024-02-03 ENCOUNTER — Ambulatory Visit (INDEPENDENT_AMBULATORY_CARE_PROVIDER_SITE_OTHER): Admitting: Audiology

## 2024-02-03 ENCOUNTER — Encounter (INDEPENDENT_AMBULATORY_CARE_PROVIDER_SITE_OTHER): Payer: Self-pay | Admitting: Physician Assistant

## 2024-02-03 ENCOUNTER — Ambulatory Visit (INDEPENDENT_AMBULATORY_CARE_PROVIDER_SITE_OTHER): Admitting: Physician Assistant

## 2024-02-03 VITALS — BP 147/83 | HR 78 | Ht 61.0 in | Wt 100.0 lb

## 2024-02-03 DIAGNOSIS — H6122 Impacted cerumen, left ear: Secondary | ICD-10-CM

## 2024-02-03 DIAGNOSIS — H903 Sensorineural hearing loss, bilateral: Secondary | ICD-10-CM | POA: Diagnosis not present

## 2024-02-03 DIAGNOSIS — H905 Unspecified sensorineural hearing loss: Secondary | ICD-10-CM

## 2024-02-03 DIAGNOSIS — H6121 Impacted cerumen, right ear: Secondary | ICD-10-CM
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
## 2024-02-03 NOTE — Progress Notes (Signed)
  [address], [address] [place], [place] [address] [phone]  Audiological Evaluation    Name: [name]     DOB:   October 05, 1927      MRN:   [id number]                                                                                     Service Date: 02/03/2024     Accompanied by: daughter   Patient comes today after Terri Rocker, PA-C sent a referral for a hearing evaluation due to concerns with hearing loss.   Symptoms Yes Details  Hearing loss  [x]  Per family  Tinnitus  []    Ear pain/ infections/pressure  []    Balance problems  []    Noise exposure history  []    Previous ear surgeries  []    Family history of hearing loss  []    Amplification  []    Other  [x]  Daughter reports she is having some memory issues.    Otoscopy: Right ear: Clear external ear canals and notable landmarks visualized on the tympanic membrane. Left ear:  Clear external ear canals and notable landmarks visualized on the tympanic membrane.  Tympanometry: Right ear: Type A- Normal external ear canal volume with normal middle ear pressure and tympanic membrane compliance. Left ear: Type A- Normal external ear canal volume with normal middle ear pressure and tympanic membrane compliance.   Pure tone Audiometry:  Normal to severe sensorineural hearing loss from 125 Hz - 8000 Hz, both ears.    Speech Audiometry: Right ear- Speech Reception Threshold (SRT) was obtained at 40 dBHL. Left ear-Speech Reception Threshold (SRT) was obtained at 45 dBHL.   Word Recognition Score Tested using NU-6 (MLV) Right ear: 60% was obtained at a presentation level of 85 dBHL with contralateral masking which is deemed as  poor. Left ear: 56% was obtained at a presentation level of 85 dBHL with contralateral masking which is deemed as  poor.   The hearing test results were completed under headphones and results are deemed to be of good to fair reliability. Test technique:  conventional      Recommendations: Follow  up with ENT as scheduled for today. Return for a hearing evaluation if concerns with hearing changes arise or per MD recommendation. Consider a communication needs assessment after medical clearance for hearing aids is obtained, pending patient motivation.   [name], AUD

## 2024-02-11 ENCOUNTER — Encounter: Payer: Self-pay | Admitting: Audiology

## 2024-02-20 DIAGNOSIS — N281 Cyst of kidney, acquired: Secondary | ICD-10-CM | POA: Insufficient documentation
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
## 2024-03-02 ENCOUNTER — Encounter: Payer: Self-pay | Admitting: Urology

## 2024-03-02 ENCOUNTER — Ambulatory Visit (INDEPENDENT_AMBULATORY_CARE_PROVIDER_SITE_OTHER): Admitting: Urology

## 2024-03-02 VITALS — BP 161/83 | HR 71

## 2024-03-02 DIAGNOSIS — N281 Cyst of kidney, acquired: Secondary | ICD-10-CM | POA: Diagnosis not present

## 2024-03-02 DIAGNOSIS — R3129 Other microscopic hematuria: Secondary | ICD-10-CM | POA: Diagnosis not present

## 2024-03-02 LAB — MICROSCOPIC EXAMINATION
Bacteria, UA: NONE SEEN
Epithelial Cells (non renal): >10 /HPF — AB (ref 0–10)

## 2024-03-02 LAB — URINALYSIS, ROUTINE W REFLEX MICROSCOPIC
Bilirubin, UA: NEGATIVE
Glucose, UA: NEGATIVE
Ketones, UA: NEGATIVE
Leukocytes,UA: NEGATIVE
Nitrite, UA: NEGATIVE
Protein,UA: NEGATIVE
Specific Gravity, UA: 1.01 (ref 1.005–1.030)
Urobilinogen, Ur: 1 mg/dL (ref 0.2–1.0)
pH, UA: 7 (ref 5.0–7.5)

## 2024-04-13 ENCOUNTER — Other Ambulatory Visit: Admitting: Urology

## 2024-04-14 ENCOUNTER — Other Ambulatory Visit (HOSPITAL_COMMUNITY): Payer: Self-pay | Admitting: Internal Medicine

## 2024-04-14 DIAGNOSIS — Z13820 Encounter for screening for osteoporosis: Secondary | ICD-10-CM

## 2024-04-15 ENCOUNTER — Ambulatory Visit (HOSPITAL_COMMUNITY)
Admission: RE | Admit: 2024-04-15 | Discharge: 2024-04-15 | Disposition: A | Discharge status: Home / Self Care | Source: Ambulatory Visit | Attending: Internal Medicine | Admitting: Internal Medicine

## 2024-04-15 DIAGNOSIS — Z13820 Encounter for screening for osteoporosis: Secondary | ICD-10-CM | POA: Diagnosis present

## 2024-04-15 DIAGNOSIS — M810 Age-related osteoporosis without current pathological fracture: Secondary | ICD-10-CM | POA: Insufficient documentation

## 2024-04-20 ENCOUNTER — Other Ambulatory Visit: Payer: Self-pay

## 2024-04-20 DIAGNOSIS — M810 Age-related osteoporosis without current pathological fracture: Secondary | ICD-10-CM | POA: Insufficient documentation

## 2024-04-21 ENCOUNTER — Telehealth: Payer: Self-pay

## 2024-04-21 NOTE — Telephone Encounter (Signed)
 Hello,  Auth Submission: DENIED Site of care: Site of care: [facility] Payer: [facility] medicare Medication & CPT/J Code(s) submitted: Prolia (Denosumab) [id number] Diagnosis Code:  Route of submission (phone, fax, portal): portal Phone # [phone] Fax # Auth type: Buy/Bill PB Units/visits requested: 60mg , q 6months x 2doses Reference number: [id number]   Authorization has been DENIED because patient must try a preferred oral and injectable medication or have an intolerance to one. You may appeal this decision at the number above. The denial letter is scanned in the media tab.

## 2024-05-30 ENCOUNTER — Other Ambulatory Visit: Payer: Self-pay | Admitting: Student

## 2024-06-16 ENCOUNTER — Encounter: Payer: Self-pay | Admitting: Cardiology

## 2024-06-16 ENCOUNTER — Ambulatory Visit: Attending: Cardiology | Admitting: Cardiology

## 2024-06-16 VITALS — BP 142/84 | HR 84 | Ht 60.0 in | Wt 99.0 lb

## 2024-06-16 DIAGNOSIS — M791 Myalgia, unspecified site: Secondary | ICD-10-CM | POA: Diagnosis not present

## 2024-06-16 DIAGNOSIS — I5022 Chronic systolic (congestive) heart failure: Secondary | ICD-10-CM

## 2024-06-16 DIAGNOSIS — I10 Essential (primary) hypertension: Secondary | ICD-10-CM

## 2024-06-16 DIAGNOSIS — I5020 Unspecified systolic (congestive) heart failure: Secondary | ICD-10-CM

## 2024-06-16 DIAGNOSIS — I25119 Atherosclerotic heart disease of native coronary artery with unspecified angina pectoris: Secondary | ICD-10-CM

## 2024-06-16 DIAGNOSIS — E782 Mixed hyperlipidemia: Secondary | ICD-10-CM | POA: Diagnosis not present

## 2024-06-16 DIAGNOSIS — T466X5D Adverse effect of antihyperlipidemic and antiarteriosclerotic drugs, subsequent encounter: Secondary | ICD-10-CM

## 2024-06-16 DIAGNOSIS — T466X5A Adverse effect of antihyperlipidemic and antiarteriosclerotic drugs, initial encounter: Secondary | ICD-10-CM

## 2024-06-16 MED ORDER — NITROGLYCERIN 0.4 MG SL SUBL: 0.4000 mg | SUBLINGUAL_TABLET | SUBLINGUAL | 3 refills | Status: AC | PRN

## 2024-06-16 NOTE — Progress Notes (Signed)
    Cardiology Office Note  Date: 06/16/2024   ID: Doshie, Maggi June 28, 1928, MRN [id number]  History of Present Illness: [name] is a 89 y.o. female last seen in March by Ms. Strader PA-C, I reviewed her note.  She is here today with family member for a follow-up visit.  Reports no angina or interval nitroglycerin  use.  Remains functional with basic ADLs.  Did have trouble with a vertebral compression fracture in the interim.  We went over her medications.  She ultimately did not take Zetia  and is not interested in any other lipid-lowering medications.  She reports compliance with the remainder of her antihypertensive regimen.  We discussed getting a refill for a fresh bottle of nitroglycerin .  I reviewed her lab work from July.  I reviewed her ECG today which shows normal sinus rhythm with old anteroseptal infarct.  Physical Exam: VS:  BP (!) 142/84 (BP Location: Right Arm, Cuff Size: Normal)   Pulse 84   Ht 5' (1.524 m)   Wt 99 lb (44.9 kg)   SpO2 97%   BMI 19.33 kg/m , BMI Body mass index is 19.33 kg/m.  Wt Readings from Last 3 Encounters:  06/16/24 99 lb (44.9 kg)  02/03/24 100 lb (45.4 kg)  12/29/23 100 lb (45.4 kg)    General: Patient appears comfortable at rest. HEENT: Conjunctiva and lids normal. Neck: Supple, no elevated JVP or carotid bruits. Lungs: Clear to auscultation, nonlabored breathing at rest. Cardiac: Regular rate and rhythm, no S3, 2/6 systolic murmur. Extremities: No pitting edema.  ECG:  An ECG dated 06/05/2023 was personally reviewed today and demonstrated:  Sinus rhythm with PVC, nonspecific ST-T changes, probable old anteroseptal infarct.  Labwork: 12/29/2023: ALT 17; AST 18; BUN 22; Creatinine, Ser 0.61; Hemoglobin 14.8; Platelets 243; Potassium 4.3; Sodium 132  July 2025: Hemoglobin 15.8, platelets 237, BUN 20, creatinine 0.74, potassium 5.1, GFR 75, AST 17, ALT 13, cholesterol 202, triglycerides 161, HDL 64, LDL 110, hemoglobin A1c 5.6%  Other  Studies Reviewed Today:  No interval cardiac testing for review today.  Assessment and Plan:  1.  CAD status post DES to the LAD in 2015.  Reports no interval angina or nitroglycerin  use, refill being provided for fresh bottle.  ECG reviewed and stable.  Continue aspirin  81 mg daily.  2.  HFmrEF, LVEF 40 to 45% with wall motion abnormalities consistent with ischemic cardiomyopathy by echocardiogram in September 2022.  No fluid retention or increasing dyspnea with basic ADLs.  GDMT limited to lisinopril  10 mg daily and Coreg  6.25 mg twice daily.  She prefers to keep her medical regimen simplified.  3.  Aortic stenosis.  Mean AV gradient 8 mmHg and dimensionless index 0.81 by echocardiogram in September 2022.  This has been followed conservatively.  No significant change in cardiac murmur.  4.  Primary hypertension.  Continue present regimen and keep follow-up with Dr. Shona.  5.  Mixed hyperlipidemia.  She has a history of statin myalgias.  HDL 64 and LDL 110 in July.  She declines further medications, did not take Zetia  in the interim.  Disposition:  Follow up 6 months.  Signed, Jayson JUDITHANN Sierras, M.D., F.A.C.C. [facility]

## 2024-06-16 NOTE — Patient Instructions (Signed)
 Medication Instructions:  Your physician recommends that you continue on your current medications as directed. Please refer to the Current Medication list given to you today.   Labwork: None today  Testing/Procedures: None today  Follow-Up: 6 months  Any Other Special Instructions Will Be Listed Below (If Applicable).  If you need a refill on your cardiac medications before your next appointment, please call your pharmacy.

## 2024-10-14 ENCOUNTER — Other Ambulatory Visit: Payer: Self-pay | Admitting: Cardiology

## 2024-10-14 DIAGNOSIS — I517 Cardiomegaly: Secondary | ICD-10-CM

## 2024-10-14 DIAGNOSIS — I25119 Atherosclerotic heart disease of native coronary artery with unspecified angina pectoris: Secondary | ICD-10-CM

## 2024-10-15 ENCOUNTER — Encounter: Payer: Self-pay | Admitting: Internal Medicine

## 2024-12-17 ENCOUNTER — Ambulatory Visit: Admitting: Student

## 2024-12-23 ENCOUNTER — Ambulatory Visit: Admitting: Cardiology
